# Patient Record
Sex: Male | Born: 1990 | Race: White | Hispanic: No | Marital: Single | Smoking: Former smoker
Health system: Southern US, Community
[De-identification: ages and names within clinical notes are randomized; demographics above are authoritative.]

## PROBLEM LIST (undated history)

## (undated) DIAGNOSIS — F1910 Other psychoactive substance abuse, uncomplicated: Secondary | ICD-10-CM

## (undated) HISTORY — PX: OTHER SURGICAL HISTORY: SHX169

## (undated) HISTORY — DX: Other psychoactive substance abuse, uncomplicated: F19.10

## (undated) HISTORY — PX: TONSILLECTOMY: SUR1361

---

## 2015-04-12 ENCOUNTER — Emergency Department (HOSPITAL_COMMUNITY)
Admission: EM | Admit: 2015-04-12 | Discharge: 2015-04-12 | Disposition: A | Discharge status: Home / Self Care | Payer: Self-pay | Attending: Emergency Medicine | Admitting: Emergency Medicine

## 2015-04-12 ENCOUNTER — Emergency Department (HOSPITAL_COMMUNITY): Payer: Self-pay

## 2015-04-12 ENCOUNTER — Encounter (HOSPITAL_COMMUNITY): Payer: Self-pay | Admitting: Emergency Medicine

## 2015-04-12 DIAGNOSIS — R55 Syncope and collapse: Secondary | ICD-10-CM | POA: Insufficient documentation

## 2015-04-12 DIAGNOSIS — F17200 Nicotine dependence, unspecified, uncomplicated: Secondary | ICD-10-CM | POA: Insufficient documentation

## 2015-04-12 LAB — COMPREHENSIVE METABOLIC PANEL
ALBUMIN: 4.8 g/dL (ref 3.5–5.0)
ALT: 35 U/L (ref 17–63)
ANION GAP: 7 (ref 5–15)
AST: 28 U/L (ref 15–41)
Alkaline Phosphatase: 71 U/L (ref 38–126)
BUN: 12 mg/dL (ref 6–20)
CHLORIDE: 107 mmol/L (ref 101–111)
CO2: 25 mmol/L (ref 22–32)
Calcium: 9.1 mg/dL (ref 8.9–10.3)
Creatinine, Ser: 0.91 mg/dL (ref 0.61–1.24)
GFR calc Af Amer: >60 mL/min (ref >60)
GFR calc non Af Amer: >60 mL/min (ref >60)
Glucose, Bld: 88 mg/dL (ref 65–99)
POTASSIUM: 3.4 mmol/L — AB (ref 3.5–5.1)
SODIUM: 139 mmol/L (ref 135–145)
Total Bilirubin: 0.8 mg/dL (ref 0.3–1.2)
Total Protein: 7.3 g/dL (ref 6.5–8.1)

## 2015-04-12 LAB — URINALYSIS, ROUTINE W REFLEX MICROSCOPIC
BILIRUBIN URINE: NEGATIVE
Glucose, UA: NEGATIVE mg/dL
Hgb urine dipstick: NEGATIVE
LEUKOCYTES UA: NEGATIVE
NITRITE: NEGATIVE
PH: 6.5 (ref 5.0–8.0)
Protein, ur: NEGATIVE mg/dL
SPECIFIC GRAVITY, URINE: 1.015 (ref 1.005–1.030)

## 2015-04-12 LAB — RAPID URINE DRUG SCREEN, HOSP PERFORMED
AMPHETAMINES: NOT DETECTED
BARBITURATES: NOT DETECTED
BENZODIAZEPINES: NOT DETECTED
COCAINE: NOT DETECTED
OPIATES: NOT DETECTED
TETRAHYDROCANNABINOL: POSITIVE — AB

## 2015-04-12 LAB — CBC WITH DIFFERENTIAL/PLATELET
BASOS ABS: 0 10*3/uL (ref 0.0–0.1)
BASOS PCT: 1 %
EOS ABS: 0 10*3/uL (ref 0.0–0.7)
Eosinophils Relative: 1 %
HCT: 44.2 % (ref 39.0–52.0)
Hemoglobin: 15.4 g/dL (ref 13.0–17.0)
Lymphocytes Relative: 35 %
Lymphs Abs: 2.1 10*3/uL (ref 0.7–4.0)
MCH: 29.4 pg (ref 26.0–34.0)
MCHC: 34.8 g/dL (ref 30.0–36.0)
MCV: 84.5 fL (ref 78.0–100.0)
MONO ABS: 0.3 10*3/uL (ref 0.1–1.0)
MONOS PCT: 5 %
NEUTROS PCT: 58 %
Neutro Abs: 3.5 10*3/uL (ref 1.7–7.7)
Platelets: 252 10*3/uL (ref 150–400)
RBC: 5.23 MIL/uL (ref 4.22–5.81)
RDW: 12.6 % (ref 11.5–15.5)
WBC: 5.9 10*3/uL (ref 4.0–10.5)

## 2015-04-12 LAB — I-STAT TROPONIN, ED: Troponin i, poc: 0 ng/mL (ref 0.00–0.08)

## 2015-04-12 MED ORDER — SODIUM CHLORIDE 0.9 % IV BOLUS (SEPSIS)
1000.0000 mL | Freq: Once | INTRAVENOUS | Status: AC
  Administered 2015-04-12: 1000 mL via INTRAVENOUS

## 2015-04-12 MED ORDER — ACETAMINOPHEN 325 MG PO TABS
650.0000 mg | ORAL_TABLET | Freq: Once | ORAL | Status: AC
  Administered 2015-04-12: 650 mg via ORAL
  Filled 2015-04-12: qty 2

## 2015-04-12 NOTE — ED Notes (Signed)
Pt states he has been having episodes for over a month where he feels he cannot get up or see.  Does not lose consciousness, but lays down and cannot move.  Pt denies any current symptoms.

## 2015-04-12 NOTE — ED Provider Notes (Signed)
CSN: [id number]     Arrival date & time 04/12/15  1808 History  By signing my name below, I, Linus Galas, attest that this documentation has been prepared under the direction and in the presence of Bethann Berkshire, MD. Electronically Signed: Linus Galas, ED Scribe. 04/12/2015. 8:16 PM.   Chief Complaint  Patient presents with  . Fatigue    Patient is a 25 y.o. male presenting with near-syncope. The history is provided by the patient. No language interpreter was used.  Near Syncope This is a recurrent problem. The current episode started more than 1 week ago. The problem occurs every several days. The problem has not changed since onset.Pertinent negatives include no chest pain, no abdominal pain and no headaches. The symptoms are aggravated by exertion. Nothing relieves the symptoms. He has tried nothing for the symptoms.   HPI Comments: Theran Vandergrift is a 25 y.o. male with no PMHx who presents to the Emergency Department for an evaluation of a near syncopal episode that occurred earlier today. Pt was working in the yard today when he became dizzy and had to sit down. Pt states that these episodes have been ongoing for the past month. Symptoms are worse with exertion.  Pt denies any other symptoms at this time.   History reviewed. No pertinent past medical history. History reviewed. No pertinent past surgical history. History reviewed. No pertinent family history. Social History  Substance Use Topics  . Smoking status: Never Smoker   . Smokeless tobacco: Current User    Types: Chew  . Alcohol Use: Yes    Review of Systems  Constitutional: Negative for appetite change and fatigue.  HENT: Negative for congestion, ear discharge and sinus pressure.   Eyes: Negative for discharge.  Respiratory: Negative for cough.   Cardiovascular: Positive for near-syncope. Negative for chest pain.  Gastrointestinal: Negative for abdominal pain and diarrhea.  Genitourinary: Negative for frequency and  hematuria.  Musculoskeletal: Negative for back pain.  Skin: Negative for rash.  Neurological: Positive for dizziness. Negative for seizures and headaches.  Psychiatric/Behavioral: Negative for hallucinations.    Allergies  Review of patient's allergies indicates no known allergies.  Home Medications   Prior to Admission medications   Not on File   BP 131/69 mmHg  Pulse 65  Temp(Src) 98.5 F (36.9 C) (Oral)  Resp 16  Ht 6' (1.829 m)  Wt 159 lb (72.122 kg)  BMI 21.56 kg/m2  SpO2 100%   Physical Exam  Constitutional: He is oriented to person, place, and time. He appears well-developed.  HENT:  Head: Normocephalic.  Eyes: Conjunctivae and EOM are normal. No scleral icterus.  Neck: Neck supple. No thyromegaly present.  Cardiovascular: Normal rate and regular rhythm.  Exam reveals no gallop and no friction rub.   No murmur heard. Pulmonary/Chest: No stridor. He has no wheezes. He has no rales. He exhibits no tenderness.  Abdominal: He exhibits no distension. There is no tenderness. There is no rebound.  Musculoskeletal: Normal range of motion. He exhibits no edema.  Lymphadenopathy:    He has no cervical adenopathy.  Neurological: He is oriented to person, place, and time. He exhibits normal muscle tone. Coordination normal.  Skin: No rash noted. No erythema.  Psychiatric: He has a normal mood and affect. His behavior is normal.    ED Course  Procedures   DIAGNOSTIC STUDIES: Oxygen Saturation is 100% on room air, normal by my interpretation.    COORDINATION OF CARE: 8:14 PM Will give fluids. Will order CT  head, EKG, blood work, urinalysis and drug screen. Discussed treatment plan with pt at bedside and pt agreed to plan.  Labs Review Labs Reviewed - No data to display  Imaging Review No results found. I have personally reviewed and evaluated these images and lab results as part of my medical decision-making.   EKG Interpretation None      MDM   Final  diagnoses:  None   Patient with near syncope. Normal CT head chest x-ray lab work. EKG. Patient instructed to take a multivitamin E3 meals a day and plenty of fluids get 8 hours of sleep a night and follow-up with family doctor next week  The chart was scribed for me under my direct supervision.  I personally performed the history, physical, and medical decision making and all procedures in the evaluation of this patient.Bethann Berkshire.    Marlynn Hinckley, MD 04/12/15 [phone]

## 2015-04-12 NOTE — Discharge Instructions (Signed)
Take a multivitamin a day. Eat 3 meals a day. Get 8 hours of sleep a day. Drink plenty of fluids during the day. And follow-up with the family doctor in 1-2 weeks

## 2015-11-24 ENCOUNTER — Encounter (HOSPITAL_COMMUNITY): Payer: Self-pay | Admitting: Emergency Medicine

## 2015-11-24 ENCOUNTER — Emergency Department (HOSPITAL_COMMUNITY)
Admission: EM | Admit: 2015-11-24 | Discharge: 2015-11-24 | Disposition: A | Discharge status: Home / Self Care | Payer: No Typology Code available for payment source | Attending: Emergency Medicine | Admitting: Emergency Medicine

## 2015-11-24 DIAGNOSIS — Y999 Unspecified external cause status: Secondary | ICD-10-CM | POA: Insufficient documentation

## 2015-11-24 DIAGNOSIS — S39012A Strain of muscle, fascia and tendon of lower back, initial encounter: Secondary | ICD-10-CM | POA: Diagnosis not present

## 2015-11-24 DIAGNOSIS — S3992XA Unspecified injury of lower back, initial encounter: Secondary | ICD-10-CM | POA: Diagnosis present

## 2015-11-24 DIAGNOSIS — M542 Cervicalgia: Secondary | ICD-10-CM | POA: Insufficient documentation

## 2015-11-24 DIAGNOSIS — Y92410 Unspecified street and highway as the place of occurrence of the external cause: Secondary | ICD-10-CM | POA: Diagnosis not present

## 2015-11-24 DIAGNOSIS — Y939 Activity, unspecified: Secondary | ICD-10-CM | POA: Insufficient documentation

## 2015-11-24 DIAGNOSIS — Z79899 Other long term (current) drug therapy: Secondary | ICD-10-CM | POA: Diagnosis not present

## 2015-11-24 DIAGNOSIS — F17210 Nicotine dependence, cigarettes, uncomplicated: Secondary | ICD-10-CM | POA: Diagnosis not present

## 2015-11-24 MED ORDER — CYCLOBENZAPRINE HCL 10 MG PO TABS: 10.0000 mg | ORAL_TABLET | Freq: Three times a day (TID) | ORAL | 0 refills | Status: DC | PRN

## 2015-11-24 MED ORDER — TRAMADOL HCL 50 MG PO TABS: 50.0000 mg | ORAL_TABLET | Freq: Four times a day (QID) | ORAL | 0 refills | Status: DC | PRN

## 2015-11-24 MED ORDER — HYDROCODONE-ACETAMINOPHEN 5-325 MG PO TABS: ORAL_TABLET | ORAL | 0 refills | Status: DC

## 2015-11-24 MED ORDER — NAPROXEN 500 MG PO TABS: 500.0000 mg | ORAL_TABLET | Freq: Two times a day (BID) | ORAL | 0 refills | Status: DC

## 2015-11-24 NOTE — Discharge Instructions (Signed)
Apply ice packs on and off to your back and thigh. Return to the ER for any worsening symptoms.
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
## 2016-01-19 ENCOUNTER — Emergency Department (HOSPITAL_COMMUNITY)
Admission: EM | Admit: 2016-01-19 | Discharge: 2016-01-19 | Disposition: A | Discharge status: Home / Self Care | Payer: Self-pay | Attending: Emergency Medicine | Admitting: Emergency Medicine

## 2016-01-19 ENCOUNTER — Encounter (HOSPITAL_COMMUNITY): Payer: Self-pay | Admitting: Emergency Medicine

## 2016-01-19 DIAGNOSIS — R197 Diarrhea, unspecified: Secondary | ICD-10-CM | POA: Insufficient documentation

## 2016-01-19 DIAGNOSIS — R112 Nausea with vomiting, unspecified: Secondary | ICD-10-CM | POA: Insufficient documentation

## 2016-01-19 DIAGNOSIS — F17220 Nicotine dependence, chewing tobacco, uncomplicated: Secondary | ICD-10-CM | POA: Insufficient documentation

## 2016-01-19 MED ORDER — ONDANSETRON HCL 8 MG PO TABS: 8.0000 mg | ORAL_TABLET | Freq: Three times a day (TID) | ORAL | 0 refills | Status: DC | PRN

## 2016-01-19 MED ORDER — FAMOTIDINE 20 MG PO TABS: 20.0000 mg | ORAL_TABLET | Freq: Two times a day (BID) | ORAL | 0 refills | Status: DC

## 2016-01-19 MED ORDER — PANTOPRAZOLE SODIUM 40 MG PO TBEC
40.0000 mg | DELAYED_RELEASE_TABLET | Freq: Once | ORAL | Status: AC
  Administered 2016-01-19: 40 mg via ORAL
  Filled 2016-01-19: qty 1

## 2016-01-19 MED ORDER — PROMETHAZINE HCL 25 MG PO TABS: 25.0000 mg | ORAL_TABLET | Freq: Four times a day (QID) | ORAL | 0 refills | Status: DC | PRN

## 2016-01-19 MED ORDER — ONDANSETRON 8 MG PO TBDP
8.0000 mg | ORAL_TABLET | Freq: Once | ORAL | Status: AC
  Administered 2016-01-19: 8 mg via ORAL
  Filled 2016-01-19: qty 1

## 2016-01-19 NOTE — ED Notes (Signed)
Pt states that he took 8mg  zofran this am.

## 2016-01-19 NOTE — ED Provider Notes (Signed)
[facility] Provider Note   CSN: [id number] Arrival date & time: 01/19/16  1019     History   Chief Complaint Chief Complaint  Patient presents with  . Emesis    HPI Kelvon Lorin PicketScott is a 25 y.o. male.  Nausea and vomiting for 2 weeks especially in the morning. Minimal diarrhea. No fever, sweats, chills, weight loss, hematemesis. He is normally healthy. He smokes cigarettes and drinks alcohol a minimal amount. This has never happened before. Severity of symptoms is mild to moderate.      History reviewed. No pertinent past medical history.  There are no active problems to display for this patient.   History reviewed. No pertinent surgical history.     Home Medications    Prior to Admission medications   Medication Sig Start Date End Date Taking? Authorizing Provider  ondansetron (ZOFRAN) 4 MG tablet Take 4 mg by mouth 2 (two) times daily.   Yes Historical Provider, MD  tetrahydrozoline 0.05 % ophthalmic solution Place 2 drops into both eyes 2 (two) times daily.   Yes Historical Provider, MD  famotidine (PEPCID) 20 MG tablet Take 1 tablet (20 mg total) by mouth 2 (two) times daily. 01/19/16   Donnetta HutchingBrian Deardra Hinkley, MD  promethazine (PHENERGAN) 25 MG tablet Take 1 tablet (25 mg total) by mouth every 6 (six) hours as needed. 01/19/16   Donnetta HutchingBrian Luisfelipe Engelstad, MD    Family History History reviewed. No pertinent family history.  Social History Social History  Substance Use Topics  . Smoking status: Former Smoker    Packs/day: 0.50    Types: Cigarettes    Quit date: 12/20/2015  . Smokeless tobacco: Current User    Types: Chew  . Alcohol use 7.2 oz/week    12 Cans of beer per week     Allergies   Patient has no known allergies.   Review of Systems Review of Systems  All other systems reviewed and are negative.    Physical Exam Updated Vital Signs BP 122/69 (BP Location: Right Arm)   Pulse (!) 54   Temp 97.9 F (36.6 C) (Oral)   Resp 18   SpO2 98%   Physical Exam    Constitutional: He is oriented to person, place, and time. He appears well-developed and well-nourished.  HENT:  Head: Normocephalic and atraumatic.  Eyes: Conjunctivae are normal.  Neck: Neck supple.  Cardiovascular: Normal rate and regular rhythm.   Pulmonary/Chest: Effort normal and breath sounds normal.  Abdominal: Soft. Bowel sounds are normal.  Musculoskeletal: Normal range of motion.  Neurological: He is alert and oriented to person, place, and time.  Skin: Skin is warm and dry.  Psychiatric: He has a normal mood and affect. His behavior is normal.  Nursing note and vitals reviewed.    ED Treatments / Results  Labs (all labs ordered are listed, but only abnormal results are displayed) Labs Reviewed - No data to display  EKG  EKG Interpretation None       Radiology No results found.  Procedures Procedures (including critical care time)  Medications Ordered in ED Medications  ondansetron (ZOFRAN-ODT) disintegrating tablet 8 mg (8 mg Oral Given 01/19/16 1333)  pantoprazole (PROTONIX) EC tablet 40 mg (40 mg Oral Given 01/19/16 1333)     Initial Impression / Assessment and Plan / ED Course  I have reviewed the triage vital signs and the nursing notes.  Pertinent labs & imaging results that were available during my care of the patient were reviewed by me and considered  in my medical decision making (see chart for details).  Clinical Course     Patient is nontoxic-appearing. He has declined IV fluids and labs. Discharge medications Zofran 8 mg and Pepcid 20 mg  Final Clinical Impressions(s) / ED Diagnoses   Final diagnoses:  Nausea and vomiting, intractability of vomiting not specified, unspecified vomiting type    New Prescriptions New Prescriptions   FAMOTIDINE (PEPCID) 20 MG TABLET    Take 1 tablet (20 mg total) by mouth 2 (two) times daily.   PROMETHAZINE (PHENERGAN) 25 MG TABLET    Take 1 tablet (25 mg total) by mouth every 6 (six) hours as needed.      Donnetta HutchingBrian Daivion Pape, MD 01/19/16 [phone]

## 2016-01-19 NOTE — ED Triage Notes (Signed)
Having n/v/d for last 2-3 weeks.  During triage pt say he injured right wrist yesterday at 12 noon, at home.  Rates pain 5/10.

## 2016-01-19 NOTE — Discharge Instructions (Signed)
Medication for nausea and to settle your stomach down. Clear liquids today. Return if worse

## 2016-01-19 NOTE — ED Notes (Signed)
PT requesting to see the EDP at this time and stated he is staying to get a work note for today.

## 2016-01-25 ENCOUNTER — Encounter (HOSPITAL_COMMUNITY): Payer: Self-pay | Admitting: Emergency Medicine

## 2016-01-25 ENCOUNTER — Emergency Department (HOSPITAL_COMMUNITY): Payer: Self-pay

## 2016-01-25 ENCOUNTER — Emergency Department (HOSPITAL_COMMUNITY)
Admission: EM | Admit: 2016-01-25 | Discharge: 2016-01-25 | Disposition: A | Discharge status: Home / Self Care | Payer: Self-pay | Attending: Emergency Medicine | Admitting: Emergency Medicine

## 2016-01-25 DIAGNOSIS — R1013 Epigastric pain: Secondary | ICD-10-CM | POA: Insufficient documentation

## 2016-01-25 DIAGNOSIS — Z79899 Other long term (current) drug therapy: Secondary | ICD-10-CM | POA: Insufficient documentation

## 2016-01-25 DIAGNOSIS — R1114 Bilious vomiting: Secondary | ICD-10-CM | POA: Insufficient documentation

## 2016-01-25 DIAGNOSIS — F17220 Nicotine dependence, chewing tobacco, uncomplicated: Secondary | ICD-10-CM | POA: Insufficient documentation

## 2016-01-25 LAB — CBC WITH DIFFERENTIAL/PLATELET
BASOS ABS: 0 10*3/uL (ref 0.0–0.1)
Basophils Relative: 0 %
Eosinophils Absolute: 0.1 10*3/uL (ref 0.0–0.7)
Eosinophils Relative: 1 %
HEMATOCRIT: 46.5 % (ref 39.0–52.0)
Hemoglobin: 16.5 g/dL (ref 13.0–17.0)
LYMPHS PCT: 32 %
Lymphs Abs: 1.6 10*3/uL (ref 0.7–4.0)
MCH: 30.4 pg (ref 26.0–34.0)
MCHC: 35.5 g/dL (ref 30.0–36.0)
MCV: 85.6 fL (ref 78.0–100.0)
Monocytes Absolute: 0.4 10*3/uL (ref 0.1–1.0)
Monocytes Relative: 7 %
NEUTROS ABS: 2.9 10*3/uL (ref 1.7–7.7)
Neutrophils Relative %: 60 %
Platelets: 242 10*3/uL (ref 150–400)
RBC: 5.43 MIL/uL (ref 4.22–5.81)
RDW: 12.3 % (ref 11.5–15.5)
WBC: 5 10*3/uL (ref 4.0–10.5)

## 2016-01-25 LAB — COMPREHENSIVE METABOLIC PANEL
ALT: 36 U/L (ref 17–63)
AST: 23 U/L (ref 15–41)
Albumin: 4.4 g/dL (ref 3.5–5.0)
Alkaline Phosphatase: 62 U/L (ref 38–126)
Anion gap: 6 (ref 5–15)
BUN: 11 mg/dL (ref 6–20)
CHLORIDE: 105 mmol/L (ref 101–111)
CO2: 27 mmol/L (ref 22–32)
CREATININE: 1.03 mg/dL (ref 0.61–1.24)
Calcium: 9.3 mg/dL (ref 8.9–10.3)
GFR calc Af Amer: >60 mL/min (ref >60)
Glucose, Bld: 96 mg/dL (ref 65–99)
Potassium: 4 mmol/L (ref 3.5–5.1)
Sodium: 138 mmol/L (ref 135–145)
TOTAL PROTEIN: 7.1 g/dL (ref 6.5–8.1)
Total Bilirubin: 1.2 mg/dL (ref 0.3–1.2)

## 2016-01-25 LAB — URINALYSIS, ROUTINE W REFLEX MICROSCOPIC
BILIRUBIN URINE: NEGATIVE
GLUCOSE, UA: NEGATIVE mg/dL
HGB URINE DIPSTICK: NEGATIVE
Ketones, ur: NEGATIVE mg/dL
Leukocytes, UA: NEGATIVE
Nitrite: NEGATIVE
Protein, ur: NEGATIVE mg/dL
SPECIFIC GRAVITY, URINE: 1.01 (ref 1.005–1.030)
pH: 8 (ref 5.0–8.0)

## 2016-01-25 LAB — LIPASE, BLOOD: LIPASE: 18 U/L (ref 11–51)

## 2016-01-25 MED ORDER — OMEPRAZOLE 20 MG PO CPDR: 20.0000 mg | DELAYED_RELEASE_CAPSULE | Freq: Every day | ORAL | 0 refills | Status: DC

## 2016-01-25 MED ORDER — SODIUM CHLORIDE 0.9 % IV BOLUS (SEPSIS)
1000.0000 mL | Freq: Once | INTRAVENOUS | Status: AC
  Administered 2016-01-25: 1000 mL via INTRAVENOUS

## 2016-01-25 MED ORDER — PROMETHAZINE HCL 25 MG PO TABS: 25.0000 mg | ORAL_TABLET | Freq: Four times a day (QID) | ORAL | 0 refills | Status: DC | PRN

## 2016-01-25 MED ORDER — ONDANSETRON 8 MG PO TBDP: 8.0000 mg | ORAL_TABLET | Freq: Three times a day (TID) | ORAL | 0 refills | Status: DC | PRN

## 2016-01-25 MED ORDER — PROMETHAZINE HCL 25 MG/ML IJ SOLN
12.5000 mg | Freq: Once | INTRAMUSCULAR | Status: AC
  Administered 2016-01-25: 12.5 mg via INTRAVENOUS
  Filled 2016-01-25: qty 1

## 2016-01-25 NOTE — Discharge Instructions (Signed)
Refer to the instructions above about the causes of nausea and vomiting.  Your lab tests and your ultrasound today are negative.  You may take the phenergan since this seems to work better for your symptoms when you do not need to be at work (since this makes you drowsy).  You may take the zofran instead on your work days.    Common reasons for nausea and vomiting with no apparent blood test or ultrasound abnormalities may include acid reflux disease, stomach or duodenal ulcers, intolerance to certain medicines or foods and marijuana use.  You are being treated with a stronger medicine today to cover you for the possibility of acid reflux or an ulcer as the source of your symptoms.

## 2016-01-25 NOTE — ED Triage Notes (Signed)
Pt c/o emesis daily x 2 weeks. Sometimes blood in emesis. Seen here a few days ago for same. States with meds no better. Mm very wet. States lurinates only about once a day.

## 2016-01-25 NOTE — ED Notes (Signed)
Patient transported to Ultrasound 

## 2016-01-25 NOTE — ED Notes (Signed)
Pt also c/o dizziness since vomiting started.

## 2016-01-25 NOTE — ED Notes (Signed)
Pt returned from US

## 2016-01-25 NOTE — ED Notes (Addendum)
Pts mother requesting to speak w EDP. Provider notified. Mother updated that US was backed up and will be 30 mins

## 2016-01-29 NOTE — ED Provider Notes (Signed)
[facility] DEPT Provider Note   CSN: [id number] Arrival date & time: 01/25/16  0908     History   Chief Complaint Chief Complaint  Patient presents with  . Emesis    HPI Francisco Rosales is a 25 y.o. male presenting with daily emesis for the past 2 weeks, stating he wakes nauseated and vomits for the first few hours of the day, then gets some improvement in the afternoon and evening. He was seen for this same problem here last week and refused tests but was given zofran and pepcid which has not been helpful.  He does endorse occasional epigastric discomfort with these symptoms.  He denies history of acid reflux. He has had no fevers, chills, back pain, dysuria, unexplained weight loss.  He was made to come here by his employer before returning to work.  The history is provided by the patient and a parent.    History reviewed. No pertinent past medical history.  There are no active problems to display for this patient.   Past Surgical History:  Procedure Laterality Date  . addenoidectomy    . TONSILLECTOMY         Home Medications    Prior to Admission medications   Medication Sig Start Date End Date Taking? Authorizing Provider  famotidine (PEPCID) 20 MG tablet Take 1 tablet (20 mg total) by mouth 2 (two) times daily. 01/19/16  Yes Donnetta HutchingBrian Cook, MD  ondansetron (ZOFRAN) 8 MG tablet Take 1 tablet (8 mg total) by mouth 3 (three) times daily as needed. 01/19/16  Yes Donnetta HutchingBrian Cook, MD  tetrahydrozoline 0.05 % ophthalmic solution Place 2 drops into both eyes 2 (two) times daily.   Yes Historical Provider, MD  omeprazole (PRILOSEC) 20 MG capsule Take 1 capsule (20 mg total) by mouth daily. 01/25/16   Burgess AmorJulie Emigdio Wildeman, PA-C  ondansetron (ZOFRAN ODT) 8 MG disintegrating tablet Take 1 tablet (8 mg total) by mouth every 8 (eight) hours as needed for nausea or vomiting. 01/25/16   Burgess AmorJulie Tenoch Mcclure, PA-C  promethazine (PHENERGAN) 25 MG tablet Take 1 tablet (25 mg total) by mouth every 6 (six) hours as  needed for nausea or vomiting. 01/25/16   Burgess AmorJulie Aubree Doody, PA-C    Family History History reviewed. No pertinent family history.  Social History Social History  Substance Use Topics  . Smoking status: Former Smoker    Packs/day: 0.50    Types: Cigarettes    Quit date: 12/20/2015  . Smokeless tobacco: Current User    Types: Chew  . Alcohol use 7.2 oz/week    12 Cans of beer per week     Comment: occ     Allergies   Patient has no known allergies.   Review of Systems Review of Systems  Constitutional: Negative for fever.  HENT: Negative for congestion and sore throat.   Eyes: Negative.   Respiratory: Negative for chest tightness and shortness of breath.   Cardiovascular: Negative for chest pain.  Gastrointestinal: Positive for abdominal pain, nausea and vomiting.  Genitourinary: Negative.   Musculoskeletal: Negative for arthralgias, joint swelling and neck pain.  Skin: Negative.  Negative for rash and wound.  Neurological: Negative for dizziness, weakness, light-headedness, numbness and headaches.  Psychiatric/Behavioral: Negative.      Physical Exam Updated Vital Signs BP 117/64   Pulse (!) 55   Temp 98.3 F (36.8 C) (Oral)   Resp 17   Ht 6' (1.829 m)   Wt 83.9 kg   SpO2 99%   BMI 25.09 kg/m  Physical Exam  Constitutional: He appears well-developed and well-nourished.  HENT:  Head: Normocephalic and atraumatic.  Mouth/Throat: Oropharynx is clear and moist.  Eyes: Conjunctivae are normal.  Neck: Normal range of motion.  Cardiovascular: Normal rate, regular rhythm, normal heart sounds and intact distal pulses.   Pulmonary/Chest: Effort normal and breath sounds normal. He has no wheezes.  Abdominal: Soft. Bowel sounds are normal. He exhibits no distension. There is tenderness in the right upper quadrant and epigastric area. There is no guarding and negative Murphy's sign.  Musculoskeletal: Normal range of motion.  Neurological: He is alert.  Skin: Skin is  warm and dry.  Psychiatric: He has a normal mood and affect.  Nursing note and vitals reviewed.    ED Treatments / Results  Labs (all labs ordered are listed, but only abnormal results are displayed) Labs Reviewed  URINALYSIS, ROUTINE W REFLEX MICROSCOPIC - Abnormal; Notable for the following:       Result Value   APPearance CLOUDY (*)    All other components within normal limits  CBC WITH DIFFERENTIAL/PLATELET  COMPREHENSIVE METABOLIC PANEL  LIPASE, BLOOD    EKG  EKG Interpretation None       Radiology  Results for orders placed or performed during the hospital encounter of 01/25/16  CBC with Differential  Result Value Ref Range   WBC 5.0 4.0 - 10.5 K/uL   RBC 5.43 4.22 - 5.81 MIL/uL   Hemoglobin 16.5 13.0 - 17.0 g/dL   HCT 16.1 09.6 - 04.5 %   MCV 85.6 78.0 - 100.0 fL   MCH 30.4 26.0 - 34.0 pg   MCHC 35.5 30.0 - 36.0 g/dL   RDW 40.9 81.1 - 91.4 %   Platelets 242 150 - 400 K/uL   Neutrophils Relative % 60 %   Neutro Abs 2.9 1.7 - 7.7 K/uL   Lymphocytes Relative 32 %   Lymphs Abs 1.6 0.7 - 4.0 K/uL   Monocytes Relative 7 %   Monocytes Absolute 0.4 0.1 - 1.0 K/uL   Eosinophils Relative 1 %   Eosinophils Absolute 0.1 0.0 - 0.7 K/uL   Basophils Relative 0 %   Basophils Absolute 0.0 0.0 - 0.1 K/uL  Comprehensive metabolic panel  Result Value Ref Range   Sodium 138 135 - 145 mmol/L   Potassium 4.0 3.5 - 5.1 mmol/L   Chloride 105 101 - 111 mmol/L   CO2 27 22 - 32 mmol/L   Glucose, Bld 96 65 - 99 mg/dL   BUN 11 6 - 20 mg/dL   Creatinine, Ser 7.82 0.61 - 1.24 mg/dL   Calcium 9.3 8.9 - 95.6 mg/dL   Total Protein 7.1 6.5 - 8.1 g/dL   Albumin 4.4 3.5 - 5.0 g/dL   AST 23 15 - 41 U/L   ALT 36 17 - 63 U/L   Alkaline Phosphatase 62 38 - 126 U/L   Total Bilirubin 1.2 0.3 - 1.2 mg/dL   GFR calc non Af Amer >60 >60 mL/min   GFR calc Af Amer >60 >60 mL/min   Anion gap 6 5 - 15  Urinalysis, Routine w reflex microscopic  Result Value Ref Range   Color, Urine YELLOW  YELLOW   APPearance CLOUDY (A) CLEAR   Specific Gravity, Urine 1.010 1.005 - 1.030   pH 8.0 5.0 - 8.0   Glucose, UA NEGATIVE NEGATIVE mg/dL   Hgb urine dipstick NEGATIVE NEGATIVE   Bilirubin Urine NEGATIVE NEGATIVE   Ketones, ur NEGATIVE NEGATIVE mg/dL   Protein, ur  NEGATIVE NEGATIVE mg/dL   Nitrite NEGATIVE NEGATIVE   Leukocytes, UA NEGATIVE NEGATIVE  Lipase, blood  Result Value Ref Range   Lipase 18 11 - 51 U/L   [place] Abdomen Complete  Result Date: 01/25/2016 CLINICAL DATA:  Right upper quadrant abdominal pain, nausea/vomiting EXAM: ABDOMEN ULTRASOUND COMPLETE COMPARISON:  None. FINDINGS: Gallbladder: No gallstones, gallbladder wall thickening, or pericholecystic fluid. Common bile duct: Diameter: 3 mm Liver: No focal lesion identified. Within normal limits in parenchymal echogenicity. IVC: No abnormality visualized. Pancreas: Visualized portion unremarkable. Spleen: Size and appearance within normal limits. Right Kidney: Length: 11.4 cm.  No mass or hydronephrosis. Left Kidney: Length: 12.8 cm.  No mass or hydronephrosis. Abdominal aorta: No aneurysm visualized. Other findings: None. IMPRESSION: Negative abdominal ultrasound. Electronically Signed   By: Charline BillsSriyesh  Krishnan M.D.   On: 01/25/2016 13:15     Procedures Procedures (including critical care time)  Medications Ordered in ED Medications  promethazine (PHENERGAN) injection 12.5 mg (12.5 mg Intravenous Given 01/25/16 1102)  sodium chloride 0.9 % bolus 1,000 mL (0 mLs Intravenous Stopped 01/25/16 1320)     Initial Impression / Assessment and Plan / ED Course  I have reviewed the triage vital signs and the nursing notes.  Pertinent labs & imaging results that were available during my care of the patient were reviewed by me and considered in my medical decision making (see chart for details).  Clinical Course     N/V of unclear etiology. Pt prescribed phenergan as this was more effective for relieving nausea sx, prilosec.   Referral to GI for further eval.  Encouraged this recheck if sx persist.    The patient appears reasonably screened and/or stabilized for discharge and I doubt any other medical condition or other [facility] requiring further screening, evaluation, or treatment in the ED at this time prior to discharge.   Final Clinical Impressions(s) / ED Diagnoses   Final diagnoses:  Bilious vomiting with nausea    New Prescriptions Discharge Medication List as of 01/25/2016  1:28 PM    START taking these medications   Details  omeprazole (PRILOSEC) 20 MG capsule Take 1 capsule (20 mg total) by mouth daily., Starting Thu 01/25/2016, Print    ondansetron (ZOFRAN ODT) 8 MG disintegrating tablet Take 1 tablet (8 mg total) by mouth every 8 (eight) hours as needed for nausea or vomiting., Starting Thu 01/25/2016, Print         Burgess AmorJulie Christiane Sistare, PA-C 01/29/16 1950    Bethann BerkshireJoseph Zammit, MD 02/06/16 1253

## 2016-03-06 ENCOUNTER — Encounter: Payer: Self-pay | Admitting: Family Medicine

## 2016-03-06 ENCOUNTER — Ambulatory Visit (INDEPENDENT_AMBULATORY_CARE_PROVIDER_SITE_OTHER): Payer: BLUE CROSS/BLUE SHIELD | Admitting: Family Medicine

## 2016-03-06 VITALS — BP 118/78 | HR 80 | Temp 97.9°F | Resp 18 | Ht 72.0 in | Wt 197.0 lb

## 2016-03-06 DIAGNOSIS — Z720 Tobacco use: Secondary | ICD-10-CM

## 2016-03-06 DIAGNOSIS — F1010 Alcohol abuse, uncomplicated: Secondary | ICD-10-CM | POA: Insufficient documentation

## 2016-03-06 DIAGNOSIS — L309 Dermatitis, unspecified: Secondary | ICD-10-CM

## 2016-03-06 DIAGNOSIS — Z7689 Persons encountering health services in other specified circumstances: Secondary | ICD-10-CM | POA: Diagnosis not present

## 2016-03-06 DIAGNOSIS — R1114 Bilious vomiting: Secondary | ICD-10-CM | POA: Diagnosis not present

## 2016-03-06 DIAGNOSIS — K920 Hematemesis: Secondary | ICD-10-CM

## 2016-03-06 DIAGNOSIS — Z2821 Immunization not carried out because of patient refusal: Secondary | ICD-10-CM | POA: Insufficient documentation

## 2016-03-06 DIAGNOSIS — K921 Melena: Secondary | ICD-10-CM | POA: Insufficient documentation

## 2016-03-06 MED ORDER — ONDANSETRON HCL 8 MG PO TABS: 8.0000 mg | ORAL_TABLET | Freq: Two times a day (BID) | ORAL | 0 refills | Status: DC

## 2016-03-06 NOTE — Progress Notes (Signed)
Chief Complaint  Patient presents with  . 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  He is coughing more during day.  He states a respirator mask is provided which he chooses not to wear because he does not like it. I explained that this might be a big part of his problem, and encouraged him to find a mask he can tolerate.   Patient Active Problem List   Diagnosis Date Noted  . Hematochezia 03/06/2016  . Tobacco abuse 03/06/2016  . Alcohol abuse 03/06/2016  . Bilious vomiting with nausea 03/06/2016  . Immunization refused 03/06/2016    Outpatient Encounter Prescriptions as of 03/06/2016  Medication Sig  . omeprazole (PRILOSEC) 20 MG capsule Take 20 mg by mouth daily.  . ondansetron (ZOFRAN) 8 MG tablet Take 1 tablet (8 mg total) by mouth 2 (two) times daily.   No facility-administered encounter medications on file as of 03/06/2016.     Past Medical History:  Diagnosis Date  . Substance abuse    alcoholic    Past Surgical History:  Procedure Laterality Date  . addenoidectomy    . TONSILLECTOMY      Social History   Social History  . Marital status: Single    Spouse name: N/A  . Number of children: N/A  . Years of education: 9   Occupational History  . pre cast concrete    Social History Main Topics  . Smoking status: Current Every Day Smoker    Packs/day: 0.50    Types: Cigarettes  . Smokeless tobacco: Current User   Types: Chew     Comment: trying to quit  . Alcohol use No     Comment: recovered alcoholic  . Drug use: No  . Sexual activity: Yes    Birth control/ protection: None   Other Topics Concern  . Not on file   Social History Narrative   Lives with mother and brother    Family History  Problem Relation Age of Onset  . Diabetes Maternal Aunt     Review of Systems  Constitutional: Positive for malaise/fatigue. Negative for chills, fever and weight loss.  HENT: Negative for congestion and hearing loss.   Eyes: Negative for blurred vision and pain.  Respiratory: Positive for cough. Negative for shortness of breath.   Cardiovascular: Negative for chest pain and leg swelling.  Gastrointestinal: Positive for abdominal pain, heartburn, nausea and vomiting. Negative for blood in stool, constipation and diarrhea.  Genitourinary: Negative for dysuria and frequency.  Musculoskeletal: Negative for falls, joint pain and myalgias.  Skin: Positive for rash.  Neurological: Negative for dizziness, seizures and headaches.  Psychiatric/Behavioral: Negative for depression. The patient is not nervous/anxious and does not have insomnia.     BP 118/78 (BP Location: Right Arm, Patient Position: Sitting, Cuff Size: Normal)   Pulse 80   Temp 97.9 F (36.6 C) (Oral)   Resp 18   Ht 6' (1.829 m)  Wt 197 lb (89.4 kg)   SpO2 98%   BMI 26.72 kg/m   Physical Exam  Constitutional: He is oriented to person, place, and time. He appears well-developed and well-nourished. No distress.  HENT:  Head: Normocephalic and atraumatic.  Right Ear: External ear normal.  Left Ear: External ear normal.  Mouth/Throat: Oropharynx is clear and moist.  Eyes: Conjunctivae are normal. Pupils are equal, round, and reactive to light.  Neck: Normal range of motion. No thyromegaly present.  Cardiovascular: Normal rate, regular rhythm and normal heart sounds.   Pulmonary/Chest: Effort normal and breath sounds normal. No  respiratory distress. He has no wheezes.  Abdominal: Soft. Bowel sounds are normal. He exhibits no distension. There is no tenderness. There is no guarding.  Musculoskeletal: Normal range of motion. He exhibits no edema.  Lymphadenopathy:    He has no cervical adenopathy.  Neurological: He is alert and oriented to person, place, and time.  Skin:  Right hand with star tattoo and there is raised skin inside border in shape of star ? Reaction to dye  Psychiatric: He has a normal mood and affect. His behavior is normal.   ASSESSMENT/PLAN:  1. Encounter to establish care with new doctor   2. Bilious vomiting with nausea  - Ambulatory referral to Gastroenterology  3. Dermatitis  - Ambulatory referral to Dermatology  4. Hematemesis with nausea  - Ambulatory referral to Gastroenterology  5. Tobacco abuse   6. Alcohol abuse   7. Immunization refused    Patient Instructions  Try to quit smoking Take the omeprazole twice a day Take the zofran as needed  I have placed a referral to GI I have placed a referral to dermatology  See me in a month or two   Eustace Moore, MD

## 2016-03-06 NOTE — Patient Instructions (Signed)
Try to quit smoking Take the omeprazole twice a day Take the zofran as needed  I have placed a referral to GI I have placed a referral to dermatology  See me in a month or two

## 2016-03-11 ENCOUNTER — Encounter: Payer: Self-pay | Admitting: Gastroenterology

## 2016-03-27 ENCOUNTER — Ambulatory Visit: Payer: BLUE CROSS/BLUE SHIELD | Admitting: Nurse Practitioner

## 2016-04-04 ENCOUNTER — Encounter: Payer: Self-pay | Admitting: Nurse Practitioner

## 2016-04-04 ENCOUNTER — Ambulatory Visit: Payer: BLUE CROSS/BLUE SHIELD | Admitting: Nurse Practitioner

## 2016-04-04 ENCOUNTER — Telehealth: Payer: Self-pay | Admitting: Nurse Practitioner

## 2016-04-04 NOTE — Telephone Encounter (Signed)
Pt was a no show and letter sent  °

## 2016-04-08 NOTE — Telephone Encounter (Signed)
Noted  

## 2016-04-30 ENCOUNTER — Ambulatory Visit: Payer: BLUE CROSS/BLUE SHIELD | Admitting: Family Medicine

## 2017-07-09 ENCOUNTER — Encounter: Payer: Self-pay | Admitting: Family Medicine

## 2017-07-09 ENCOUNTER — Other Ambulatory Visit: Payer: Self-pay

## 2017-07-09 ENCOUNTER — Ambulatory Visit (INDEPENDENT_AMBULATORY_CARE_PROVIDER_SITE_OTHER): Payer: BLUE CROSS/BLUE SHIELD | Admitting: Family Medicine

## 2017-07-09 VITALS — BP 106/70 | HR 62 | Temp 98.7°F | Resp 12 | Ht 72.0 in | Wt 216.1 lb

## 2017-07-09 DIAGNOSIS — L089 Local infection of the skin and subcutaneous tissue, unspecified: Secondary | ICD-10-CM

## 2017-07-09 DIAGNOSIS — Z23 Encounter for immunization: Secondary | ICD-10-CM

## 2017-07-09 DIAGNOSIS — L309 Dermatitis, unspecified: Secondary | ICD-10-CM

## 2017-07-09 DIAGNOSIS — Z720 Tobacco use: Secondary | ICD-10-CM

## 2017-07-09 MED ORDER — SULFAMETHOXAZOLE-TRIMETHOPRIM 800-160 MG PO TABS: 1.0000 | ORAL_TABLET | Freq: Two times a day (BID) | ORAL | 0 refills | Status: DC

## 2017-07-09 MED ORDER — TRIAMCINOLONE ACETONIDE 0.1 % EX CREA: 1.0000 "application " | TOPICAL_CREAM | Freq: Two times a day (BID) | CUTANEOUS | 0 refills | Status: DC

## 2017-07-09 NOTE — Progress Notes (Signed)
Patient ID: Francisco Rosales, male    DOB: 01-17-1991, 27 y.o.   MRN: [id number]  Chief Complaint  Patient presents with  . Establish Care    Former Francisco Rosales patient  . 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  Subjective:   Francisco Rosales is a 27 y.o. male who [facility].  HPI Mr. Francisco Rosales presents today as a new patient to establish care after being followed by Francisco Rosales. Lily Francisco Rosales.  He comes in today to discuss an area on his right hand.  He reports that he has had this skin lesion on right hand, has been there for about a year. Was told to "just watch it" per his mother's report, by Francisco Rosales. Delton Rosales. Has never been biopsied. Has put peroxide on it.  He reports that the area on the skin breaks open and drains green. Has never been given antibiotics.  He reports that the area itches and then he tends to mess and picks at the area.  From time to time he reports that it feels sore.  He reports that he often gets out his pocket knife and scratches the area with the knife because it itches.  The area is around the borders of a star tattoo which he has on his hand.  He reports that the tattoo is not new and is many years old.  He reports that is over 26 years old.  He reports that he never had this problem and to may be a year or so ago. Has not used any creams or ointments on it. Quit working at [employer] in 01/2017.  Reports that in the past that had raised up and stuff it drained out of it.  He reports that he squeezed and got all the material out of it.  It is not draining at this time.  Reports that he is not smoking any cigarettes.  He reports that he occasionally dips. Lives with his mother and brother.  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   Past Medical History:  Diagnosis Date  . Substance abuse (HCC)    alcoholic    Past Surgical History:  Procedure Laterality  Date  . addenoidectomy    . TONSILLECTOMY      Family History  Problem Relation Age of Onset  . Diabetes Maternal Aunt      Social History   Socioeconomic History  . Marital status: Single    Spouse name: Not on file  . Number of children: Not on file  . Years of education: 849  . Highest education level: Not on file  Occupational History  . Occupation: pre cast concrete  Social Needs  . Financial resource strain: Not on file  . Food insecurity:    Worry: Not on file    Inability: Not on file  . Transportation needs:    Medical: Not on file    Non-medical: Not on file  Tobacco Use  . Smoking status: Former Smoker    Packs/day: 0.50    Types: Cigarettes  . Smokeless tobacco: Current User    Types: Chew  . Tobacco comment: trying to quit  Substance and Sexual Activity  . Alcohol use: No    Comment: recovered alcoholic  . Drug use: No  . Sexual activity: Yes    Birth control/protection: None  Lifestyle  . Physical  activity:    Days per week: Not on file    Minutes per session: Not on file  . Stress: Not on file  Relationships  . Social connections:    Talks on phone: Not on file    Gets together: Not on file    Attends religious service: Not on file    Active member of club or organization: Not on file    Attends meetings of clubs or organizations: Not on file    Relationship status: Not on file  Other Topics Concern  . Not on file  Social History Narrative   Lives with mother and brother.   Used to work at [employer].    Works at [employer].    Does not eat fruits or veggies.   Does not smoke.   Dips tobacco.   Enjoys hunting and fishing.    Current Outpatient Medications on File Prior to Visit  Medication Sig Dispense Refill  . ranitidine (ZANTAC) 150 MG tablet Take 150 mg by mouth 2 (two) times daily as needed for heartburn.     No current facility-administered medications on file prior to visit.     Review of Systems   Objective:   BP  106/70 (BP Location: Left Arm, Patient Position: Sitting, Cuff Size: Large)   Pulse 62   Temp 98.7 F (37.1 C) (Temporal)   Resp 12   Ht 6' (1.829 m)   Wt 216 lb 1.9 oz (98 kg)   SpO2 96%   BMI 29.31 kg/m   Physical Exam  Constitutional: He is oriented to person, place, and time. He appears well-developed and well-nourished.  HENT:  Head: Normocephalic and atraumatic.  Eyes: Pupils are equal, round, and reactive to light. EOM are normal.  Neck: Normal range of motion. Neck supple. No thyromegaly present.  Cardiovascular: Normal rate, regular rhythm and normal heart sounds.  Pulmonary/Chest: Effort normal and breath sounds normal.  Abdominal: Soft. Bowel sounds are normal.  Musculoskeletal: He exhibits no edema.  Lymphadenopathy:    He has no cervical adenopathy.  Neurological: He is alert and oriented to person, place, and time. No cranial nerve deficit.  Skin: Skin is warm, dry and intact. Capillary refill takes less than 2 seconds.  Clearly demarcated star tattoo on the dorsum of right hand.  Upper border of tattoo with thickened whitish scale and mild surrounding erythema with associated lichenification.  No drainage associated with area.  No fluctuance.  The skin lesion in question follows the borders of the tattoo.    Neurovasculature intact in upper extremities bilaterally.  Patient covered with multiple tattoos on neck, arms, torso, and hands.  Psychiatric:  Patient looks to his mother to answer most of his questions.  His mood is somewhat inappropriate and childlike.  Thought processes  without delusions or phobias.  Behavior appropriate.  Questionable normal cognition.  Vitals reviewed.  Depression screen [facility] 2/9 07/09/2017 03/06/2016  Decreased Interest 0 0  Down, Depressed, Hopeless 0 0  PHQ - 2 Score 0 0    Assessment and Plan   1. Eczema, unspecified type Long discussion with patient and his mother today.  I believe that his skin rash is secondary to allergy/skin  irritant.  Possibly secondary to association with tattoos since the skin rash follows the borders of the tattoo.  Today we discussed eczema.  We discussed skin hydration.  I also believe that patient has an itch scratch cycle in regards to this lesion.  I discussed with him that I  believe the episodes of drainage that he is describing are most likely a result of secondary infection from him picking and scraping the skin rash.  We will try triamcinolone twice a day as directed and after 1 to 2 weeks decrease to once a day, and then decreased to as needed.  We also discussed that if the area was not healed and cleared up that we would refer him to dermatology for biopsy. - triamcinolone cream (KENALOG) 0.1 %; Apply 1 application topically 2 (two) times daily.  Dispense: 30 g; Refill: 0  2. Skin infection We will go ahead and treat at this time for secondary infection due to the history of this area draining even last week.  There is no evidence of this seen but will treat at this time. Patient counseled in detail regarding the risks of medication. Told to call or return to clinic if develop any worrisome signs or symptoms. Patient voiced understanding.   - sulfamethoxazole-trimethoprim (BACTRIM DS,SEPTRA DS) 800-160 MG tablet; Take 1 tablet by mouth 2 (two) times daily.  Dispense: 20 tablet; Refill: 0  3. Immunization due Vaccination given. - Tdap vaccine greater than or equal to 7yo IM   Recommend patient to follow-up for complete physical examination. The 5 A's Model for treating Tobacco Use and Dependence was used today. I have identified and documented tobacco use status for this patient. I have urged the patient to quit tobacco use. At this time, the patient is unwilling and not ready to attempt to quit. I have provided patient with information regarding risks, cessation techniques, and interventions that might increase future attempts to quit smoking. I will plan on again addressing tobacco  dependence at the next visit.  Return in about 1 month (around 08/06/2017) for follow up. Aliene Beams, MD 07/09/2017

## 2017-07-11 ENCOUNTER — Encounter: Payer: Self-pay | Admitting: Family Medicine

## 2017-07-21 ENCOUNTER — Telehealth: Payer: Self-pay | Admitting: Family Medicine

## 2017-07-21 ENCOUNTER — Other Ambulatory Visit: Payer: Self-pay

## 2017-07-21 DIAGNOSIS — L309 Dermatitis, unspecified: Secondary | ICD-10-CM

## 2017-07-21 MED ORDER — TRIAMCINOLONE ACETONIDE 0.1 % EX CREA: 1.0000 "application " | TOPICAL_CREAM | Freq: Two times a day (BID) | CUTANEOUS | 0 refills | Status: DC

## 2017-07-21 NOTE — Telephone Encounter (Signed)
Spoke with patient and verified which pharmacy he would like cream sent to [facility] Resent cream to pharmacy. Patient notified.

## 2017-07-21 NOTE — Telephone Encounter (Signed)
Can you re-send the cream medication you sent in on 6/5.  The Pharmacy says they do not have it.  We show confirmation on 07/09/17.  They have re-checked with the pharmacy and they do not have it.

## 2017-08-13 ENCOUNTER — Ambulatory Visit: Payer: BLUE CROSS/BLUE SHIELD | Admitting: Family Medicine

## 2017-08-27 ENCOUNTER — Ambulatory Visit: Payer: BLUE CROSS/BLUE SHIELD | Admitting: Family Medicine

## 2019-04-27 ENCOUNTER — Encounter (HOSPITAL_COMMUNITY): Payer: Self-pay | Admitting: *Deleted

## 2019-04-27 ENCOUNTER — Emergency Department (HOSPITAL_COMMUNITY): Payer: Self-pay

## 2019-04-27 ENCOUNTER — Other Ambulatory Visit: Payer: Self-pay

## 2019-04-27 ENCOUNTER — Emergency Department (HOSPITAL_COMMUNITY)
Admission: EM | Admit: 2019-04-27 | Discharge: 2019-04-27 | Disposition: A | Discharge status: Home / Self Care | Payer: Self-pay | Attending: Emergency Medicine | Admitting: Emergency Medicine

## 2019-04-27 DIAGNOSIS — Z79899 Other long term (current) drug therapy: Secondary | ICD-10-CM | POA: Insufficient documentation

## 2019-04-27 DIAGNOSIS — S51811A Laceration without foreign body of right forearm, initial encounter: Secondary | ICD-10-CM | POA: Insufficient documentation

## 2019-04-27 DIAGNOSIS — Z23 Encounter for immunization: Secondary | ICD-10-CM | POA: Insufficient documentation

## 2019-04-27 DIAGNOSIS — S56821A Laceration of other muscles, fascia and tendons at forearm level, right arm, initial encounter: Secondary | ICD-10-CM | POA: Insufficient documentation

## 2019-04-27 DIAGNOSIS — Y999 Unspecified external cause status: Secondary | ICD-10-CM | POA: Insufficient documentation

## 2019-04-27 DIAGNOSIS — Z87891 Personal history of nicotine dependence: Secondary | ICD-10-CM | POA: Insufficient documentation

## 2019-04-27 DIAGNOSIS — S41111A Laceration without foreign body of right upper arm, initial encounter: Secondary | ICD-10-CM

## 2019-04-27 DIAGNOSIS — Y939 Activity, unspecified: Secondary | ICD-10-CM | POA: Insufficient documentation

## 2019-04-27 DIAGNOSIS — W25XXXA Contact with sharp glass, initial encounter: Secondary | ICD-10-CM | POA: Insufficient documentation

## 2019-04-27 DIAGNOSIS — Y929 Unspecified place or not applicable: Secondary | ICD-10-CM | POA: Insufficient documentation

## 2019-04-27 DIAGNOSIS — S50851A Superficial foreign body of right forearm, initial encounter: Secondary | ICD-10-CM

## 2019-04-27 DIAGNOSIS — W01110A Fall on same level from slipping, tripping and stumbling with subsequent striking against sharp glass, initial encounter: Secondary | ICD-10-CM | POA: Insufficient documentation

## 2019-04-27 LAB — CBC WITH DIFFERENTIAL/PLATELET
Abs Immature Granulocytes: 0.03 10*3/uL (ref 0.00–0.07)
Basophils Absolute: 0 10*3/uL (ref 0.0–0.1)
Basophils Relative: 0 %
Eosinophils Absolute: 0 10*3/uL (ref 0.0–0.5)
Eosinophils Relative: 0 %
HCT: 51.3 % (ref 39.0–52.0)
Hemoglobin: 17.1 g/dL — ABNORMAL HIGH (ref 13.0–17.0)
Immature Granulocytes: 0 %
Lymphocytes Relative: 19 %
Lymphs Abs: 1.9 10*3/uL (ref 0.7–4.0)
MCH: 28.8 pg (ref 26.0–34.0)
MCHC: 33.3 g/dL (ref 30.0–36.0)
MCV: 86.4 fL (ref 80.0–100.0)
Monocytes Absolute: 0.6 10*3/uL (ref 0.1–1.0)
Monocytes Relative: 6 %
Neutro Abs: 7.7 10*3/uL (ref 1.7–7.7)
Neutrophils Relative %: 75 %
Platelets: 271 10*3/uL (ref 150–400)
RBC: 5.94 MIL/uL — ABNORMAL HIGH (ref 4.22–5.81)
RDW: 12.4 % (ref 11.5–15.5)
WBC: 10.3 10*3/uL (ref 4.0–10.5)
nRBC: 0 % (ref 0.0–0.2)

## 2019-04-27 LAB — BASIC METABOLIC PANEL
Anion gap: 10 (ref 5–15)
BUN: 11 mg/dL (ref 6–20)
CO2: 20 mmol/L — ABNORMAL LOW (ref 22–32)
Calcium: 9.3 mg/dL (ref 8.9–10.3)
Chloride: 111 mmol/L (ref 98–111)
Creatinine, Ser: 0.84 mg/dL (ref 0.61–1.24)
GFR calc Af Amer: >60 mL/min (ref >60)
GFR calc non Af Amer: >60 mL/min (ref >60)
Glucose, Bld: 95 mg/dL (ref 70–99)
Potassium: 3.6 mmol/L (ref 3.5–5.1)
Sodium: 141 mmol/L (ref 135–145)

## 2019-04-27 MED ORDER — IBUPROFEN 600 MG PO TABS: 600.0000 mg | ORAL_TABLET | Freq: Four times a day (QID) | ORAL | 0 refills | Status: DC | PRN

## 2019-04-27 MED ORDER — HYDROMORPHONE HCL 1 MG/ML IJ SOLN
1.0000 mg | Freq: Once | INTRAMUSCULAR | Status: AC
  Administered 2019-04-27: 1 mg via INTRAVENOUS
  Filled 2019-04-27: qty 1

## 2019-04-27 MED ORDER — CEPHALEXIN 500 MG PO CAPS: 500.0000 mg | ORAL_CAPSULE | Freq: Four times a day (QID) | ORAL | 0 refills | Status: DC

## 2019-04-27 MED ORDER — LIDOCAINE-EPINEPHRINE (PF) 2 %-1:200000 IJ SOLN: 20.0000 mL | Freq: Once | INTRAMUSCULAR | Status: DC

## 2019-04-27 MED ORDER — TETANUS-DIPHTH-ACELL PERTUSSIS 5-2.5-18.5 LF-MCG/0.5 IM SUSP
0.5000 mL | Freq: Once | INTRAMUSCULAR | Status: AC
  Administered 2019-04-27: 0.5 mL via INTRAMUSCULAR
  Filled 2019-04-27: qty 0.5

## 2019-04-27 MED ORDER — ACETAMINOPHEN 500 MG PO TABS: 500.0000 mg | ORAL_TABLET | Freq: Four times a day (QID) | ORAL | 0 refills | Status: DC | PRN

## 2019-04-27 MED ORDER — CEFAZOLIN SODIUM-DEXTROSE 1-4 GM/50ML-% IV SOLN
1.0000 g | Freq: Once | INTRAVENOUS | Status: AC
  Administered 2019-04-27: 1 g via INTRAVENOUS
  Filled 2019-04-27: qty 50

## 2019-04-27 MED ORDER — LIDOCAINE-EPINEPHRINE (PF) 1 %-1:200000 IJ SOLN
30.0000 mL | Freq: Once | INTRAMUSCULAR | Status: DC
  Filled 2019-04-27: qty 30

## 2019-04-27 MED ORDER — FENTANYL CITRATE (PF) 100 MCG/2ML IJ SOLN
50.0000 ug | Freq: Once | INTRAMUSCULAR | Status: AC
  Administered 2019-04-27: 50 ug via INTRAVENOUS
  Filled 2019-04-27: qty 2

## 2019-04-27 NOTE — ED Triage Notes (Signed)
Patient presents to the ED via [facility] due to a laceration to right forearm sustained from tripping over some steps into a glass door at this home.  Patient has received a total of 8mg  IV Morphine given by EMS.

## 2019-04-27 NOTE — Discharge Instructions (Addendum)
1. Medications: Please take all of your antibiotics until finished!   Take your antibiotics with food.  Common side effects of antibiotics include nausea, vomiting, abdominal discomfort, and diarrhea. You may help offset some of this with probiotics which you can buy or get in yogurt. Do not eat  or take the probiotics until 2 hours after your antibiotic.    Alternate 600 mg of ibuprofen and [phone] mg of Tylenol every 3-6 hours as needed for pain. Do not exceed 4000 mg of Tylenol daily.  Take ibuprofen with food to avoid upset stomach issues. 2. Treatment: ice for swelling, keep wound clean with warm soap and water and keep bandage dry, do not submerge in water for 24 hours 3. Follow Up: Go to Dr. Carlos Levering office at 8 AM on Thursday.  I would call the office tomorrow to confirm the appointment.  Tell them that I spoke with Dr. Amanda Pea in the emergency department and he said to go to the office on Thursday at 8 AM.  You have shards of glass in the arm that will need to be removed.  Return to the emergency department sooner if any concerning signs or symptoms develop such as high fevers, redness, drainage of pus from the wound, or swelling.   WOUND CARE  Keep area clean and dry for 24 hours. Do not remove bandage, if applied.  After 24 hours, remove bandage and wash wound gently with mild soap and warm water. Reapply a new bandage after cleaning wound, if directed.   Continue daily cleansing with soap and water until stitches/staples are removed.  Do not apply any ointments or creams to the wound while stitches/staples are in place, as this may cause delayed healing. Return if you experience any of the following signs of infection: Swelling, redness, pus drainage, streaking, fever >101.0 F  Return if you experience excessive bleeding that does not stop after 15-20 minutes of constant, firm pressure.

## 2019-04-27 NOTE — ED Provider Notes (Signed)
[facility] EMERGENCY DEPARTMENT Provider Note   CSN: [id number] Arrival date & time: 04/27/19  1543     History Chief Complaint  Patient presents with   Laceration    right forearm    Francisco Rosales is a 29 y.o. male with history of alcohol abuse, tobacco abuse presents for evaluation of acute onset, persistent wounds to the right forearm sustained around 1 PM today.  He reports that he tripped landed forward with his outstretched fist on a storm glass door, causing the glass to shatter.  He reports severe pain around the wounds, worsens with palpation.  He notes shards of glass within the wounds.  Pain worsens with movement of his hand.  He denies numbness or weakness of extremity.  He is left-hand dominant.  He is unsure if his tetanus is up-to-date.  The history is provided by the patient.       Past Medical History:  Diagnosis Date   Substance abuse (HCC)    alcoholic    Patient Active Problem List   Diagnosis Date Noted   Hematochezia 03/06/2016   Tobacco abuse 03/06/2016   Alcohol abuse 03/06/2016   Bilious vomiting with nausea 03/06/2016   Immunization refused 03/06/2016    Past Surgical History:  Procedure Laterality Date   addenoidectomy     TONSILLECTOMY         Family History  Problem Relation Age of Onset   Diabetes Maternal Aunt     Social History   Tobacco Use   Smoking status: Former Smoker    Packs/day: 0.50    Types: Cigarettes   Smokeless tobacco: Current User    Types: Chew   Tobacco comment: trying to quit  Substance Use Topics   Alcohol use: No    Comment: recovered alcoholic   Drug use: No    Home Medications Prior to Admission medications   Medication Sig Start Date End Date Taking? Authorizing Provider  acetaminophen (TYLENOL) 500 MG tablet Take 1 tablet (500 mg total) by mouth every 6 (six) hours as needed. 04/27/19   Luevenia Maxin, Jeromie Gainor A, PA-C  cephALEXin (KEFLEX) 500 MG capsule Take 1 capsule (500 mg total) by mouth 4  (four) times daily for 5 days. 04/27/19 05/02/19  Michela Pitcher A, PA-C  ibuprofen (ADVIL) 600 MG tablet Take 1 tablet (600 mg total) by mouth every 6 (six) hours as needed. 04/27/19   Luevenia Maxin, Cinch Ormond A, PA-C  ranitidine (ZANTAC) 150 MG tablet Take 150 mg by mouth 2 (two) times daily as needed for heartburn.    [provider]  sulfamethoxazole-trimethoprim (BACTRIM DS,SEPTRA DS) 800-160 MG tablet Take 1 tablet by mouth 2 (two) times daily. 07/09/17   Aliene Beams, MD  triamcinolone cream (KENALOG) 0.1 % Apply 1 application topically 2 (two) times daily. 07/21/17   Aliene Beams, MD    Allergies    Patient has no known allergies.  Review of Systems   Review of Systems  Constitutional: Negative for fever.  Skin: Positive for wound.  Neurological: Negative for weakness and numbness.    Physical Exam Updated Vital Signs BP 128/83 (BP Location: Left Arm)    Pulse (!) 59    Temp 98.6 F (37 C) (Oral)    Resp 20    Ht 6' (1.829 m)    Wt 81.6 kg    SpO2 99%    BMI 24.41 kg/m   Physical Exam Vitals and nursing note reviewed.  Constitutional:      General: He is not in  acute distress.    Appearance: He is well-developed.  HENT:     Head: Normocephalic and atraumatic.  Eyes:     General:        Right eye: No discharge.        Left eye: No discharge.     Conjunctiva/sclera: Conjunctivae normal.  Neck:     Vascular: No JVD.     Trachea: No tracheal deviation.  Cardiovascular:     Rate and Rhythm: Normal rate.     Pulses: Normal pulses.     Comments: 2+ radial pulses bilaterally Pulmonary:     Effort: Pulmonary effort is normal.  Abdominal:     General: There is no distension.  Musculoskeletal:     Comments: See below images.  Patient with multiple lacerations to the right forearm, bleeding mostly controlled.  There is exposed fascia and underlying muscle belly.  There are also shards of glass noted to the lacerations.  5/5 strength of BUE major muscle groups with flexion and  extension against resistance including the wrist and digits.  Good grip strength bilaterally.  Skin:    General: Skin is warm and dry.     Findings: No erythema.  Neurological:     Mental Status: He is alert.     Comments: Sensation intact to light touch of bilateral upper extremities  Psychiatric:        Behavior: Behavior normal.          ED Results / Procedures / Treatments   Labs (all labs ordered are listed, but only abnormal results are displayed) Labs Reviewed  CBC WITH DIFFERENTIAL/PLATELET - Abnormal; Notable for the following components:      Result Value   RBC 5.94 (*)    Hemoglobin 17.1 (*)    All other components within normal limits  BASIC METABOLIC PANEL - Abnormal; Notable for the following components:   CO2 20 (*)    All other components within normal limits    EKG None  Radiology DG Forearm Right  Result Date: 04/27/2019 CLINICAL DATA:  Right forearm laceration EXAM: RIGHT FOREARM - 2 VIEW COMPARISON:  None. FINDINGS: There is no evidence of fracture or other focal bone lesions. Soft tissue defect over the volar aspect of the forearm compatible with history of laceration. There are multiple rectangular and smaller irregularly shaped radiopaque foreign bodies within the soft tissues of the volar and radial aspects of the forearm. The largest is a rectangular density measuring 2.8 x 0.3 cm. There are at least 7 discrete foreign bodies measuring at least 0.3 cm in size. Additional tiny millimetric punctate densities at the level of the skin laceration. IMPRESSION: 1. No acute osseous abnormality. 2. Multiple radiopaque foreign bodies within the soft tissues of the forearm as described above. The largest is a rectangular density measuring 2.8 x 0.3 cm in size. Electronically Signed   By: Duanne Guess D.O.   On: 04/27/2019 16:32    Procedures .Marland KitchenLaceration Repair  Date/Time: 04/27/2019 8:48 PM Performed by: Jeanie Sewer, PA-C Authorized by: Jeanie Sewer,  PA-C   Consent:    Consent obtained:  Verbal   Consent given by:  Patient   Risks discussed:  Infection, need for additional repair, pain, poor cosmetic result and poor wound healing   Alternatives discussed:  No treatment and delayed treatment Universal protocol:    Procedure explained and questions answered to patient or proxy's satisfaction: yes     Relevant documents present and verified: yes  Test results available and properly labeled: yes     Imaging studies available: yes     Required blood products, implants, devices, and special equipment available: yes     Site/side marked: yes     Immediately prior to procedure, a time out was called: yes     Patient identity confirmed:  Verbally with patient Anesthesia (see MAR for exact dosages):    Anesthesia method:  Local infiltration   Local anesthetic:  Lidocaine 2% WITH epi Laceration details:    Location:  Shoulder/arm   Shoulder/arm location:  R lower arm   Length (cm):  5   Depth (mm):  6 Pre-procedure details:    Preparation:  Patient was prepped and draped in usual sterile fashion and imaging obtained to evaluate for foreign bodies Exploration:    Hemostasis achieved with:  Direct pressure   Wound exploration: wound explored through full range of motion     Wound extent: fascia violated, foreign bodies/material and muscle damage     Wound extent: no underlying fracture noted     Foreign bodies/material:  Yes; glass   Contaminated: yes   Treatment:    Area cleansed with:  Betadine and saline   Amount of cleaning:  Extensive   Irrigation solution:  Sterile saline   Irrigation method:  Pressure wash   Visualized foreign bodies/material removed: yes   Mucous membrane repair:    Suture size:  4-0   Suture material:  Vicryl   Suture technique:  Simple interrupted   Number of sutures:  3 Skin repair:    Repair method:  Sutures   Suture size:  4-0   Suture material:  Prolene   Suture technique:  Simple interrupted    Number of sutures:  5 Approximation:    Approximation:  Loose Post-procedure details:    Dressing:  Sterile dressing and non-adherent dressing   Patient tolerance of procedure:  Tolerated well, no immediate complications .Marland KitchenLaceration Repair  Date/Time: 04/27/2019 8:51 PM Performed by: Jeanie Sewer, PA-C Authorized by: Jeanie Sewer, PA-C   Consent:    Consent obtained:  Verbal   Consent given by:  Patient   Risks discussed:  Infection, pain, poor cosmetic result, poor wound healing and retained foreign body Anesthesia (see MAR for exact dosages):    Anesthesia method:  Local infiltration   Local anesthetic:  Lidocaine 2% WITH epi Laceration details:    Location:  Shoulder/arm   Shoulder/arm location:  R lower arm   Length (cm):  2.6   Depth (mm):  4 Repair type:    Repair type:  Intermediate Pre-procedure details:    Preparation:  Patient was prepped and draped in usual sterile fashion and imaging obtained to evaluate for foreign bodies Exploration:    Hemostasis achieved with:  Direct pressure   Wound exploration: wound explored through full range of motion     Wound extent: foreign bodies/material     Wound extent: no underlying fracture noted     Foreign bodies/material:  Glass   Contaminated: yes   Treatment:    Area cleansed with:  Betadine and saline   Amount of cleaning:  Extensive   Irrigation method:  Pressure wash Skin repair:    Repair method:  Sutures   Suture size:  4-0   Suture material:  Prolene   Suture technique:  Simple interrupted   Number of sutures:  2 Approximation:    Approximation:  Loose Post-procedure details:    Dressing:  Sterile dressing and  non-adherent dressing   Patient tolerance of procedure:  Tolerated well, no immediate complications .Marland Kitchen.Laceration Repair  Date/Time: 04/27/2019 8:52 PM Performed by: Jeanie SewerFawze, Dorwin Fitzhenry A, PA-C Authorized by: Jeanie SewerFawze, Michille Mcelrath A, PA-C   Consent:    Consent obtained:  Verbal   Consent given by:  Patient    Risks discussed:  Infection, need for additional repair, pain, poor cosmetic result, poor wound healing and retained foreign body   Alternatives discussed:  No treatment and delayed treatment Universal protocol:    Procedure explained and questions answered to patient or proxy's satisfaction: yes     Relevant documents present and verified: yes     Test results available and properly labeled: yes     Imaging studies available: yes     Required blood products, implants, devices, and special equipment available: yes     Site/side marked: yes     Immediately prior to procedure, a time out was called: yes     Patient identity confirmed:  Verbally with patient Anesthesia (see MAR for exact dosages):    Anesthesia method:  Local infiltration   Local anesthetic:  Lidocaine 2% WITH epi Laceration details:    Location:  Shoulder/arm   Shoulder/arm location:  R lower arm   Length (cm):  4   Depth (mm):  6 Repair type:    Repair type:  Intermediate Pre-procedure details:    Preparation:  Imaging obtained to evaluate for foreign bodies and patient was prepped and draped in usual sterile fashion Exploration:    Hemostasis achieved with:  Direct pressure   Wound exploration: wound explored through full range of motion     Wound extent: areolar tissue violated and foreign bodies/material     Contaminated: yes   Treatment:    Area cleansed with:  Saline and Betadine   Amount of cleaning:  Extensive   Irrigation solution:  Sterile saline   Irrigation method:  Pressure wash   Visualized foreign bodies/material removed: no   Skin repair:    Repair method:  Sutures   Suture size:  4-0   Suture material:  Prolene   Suture technique:  Simple interrupted   Number of sutures:  3 Approximation:    Approximation:  Loose Post-procedure details:    Dressing:  Sterile dressing and non-adherent dressing   Patient tolerance of procedure:  Tolerated well, no immediate complications   (including  critical care time)  Medications Ordered in ED Medications  lidocaine-EPINEPHrine (XYLOCAINE W/EPI) 2 %-1:200000 (PF) injection 20 mL (has no administration in time range)  Tdap (BOOSTRIX) injection 0.5 mL (0.5 mLs Intramuscular Given 04/27/19 1622)  ceFAZolin (ANCEF) IVPB 1 g/50 mL premix (0 g Intravenous Stopped 04/27/19 1720)  fentaNYL (SUBLIMAZE) injection 50 mcg (50 mcg Intravenous Given 04/27/19 1621)  HYDROmorphone (DILAUDID) injection 1 mg (1 mg Intravenous Given 04/27/19 1815)    ED Course  I have reviewed the triage vital signs and the nursing notes.  Pertinent labs & imaging results that were available during my care of the patient were reviewed by me and considered in my medical decision making (see chart for details).    MDM Rules/Calculators/A&P                      Patient presenting for evaluation of multiple lacerations to the right forearm.  Shards of glass are visible and there also appears to be damage to the fascia and muscle but he is neurovascularly intact and there does not appear to be any evidence  of tendon involvement.  Radiographs show multiple foreign bodies but no acute osseous abnormality.  CONSULT: Spoke with Dr. Amedeo Plenty with hand surgery who recommends loosely closing the wounds after extensively irrigating and attempting to remove as many of the foreign bodies as possible.  He will see the patient in follow-up in the office on Thursday at 8 AM for reevaluation.  I also recommend sending the patient home on a course of Keflex.  Patient received IV Ancef in the ED.  His pain was controlled in the ED.  The wounds were extensively irrigated and base of the wounds were visualized in a bloodless field.  I was able to remove 2 shards of glass but was unable to visualize or remove any more.  2 of his lacerations appear to be quite deep.  His tetanus was updated in the ED.  He tolerated suture repair with loose closure.  Sterile dressing was applied.  Discussed wound care  with patient and his significant other who was available on the phone.  He understands to follow-up with Dr. Vanetta Shawl office in 2 days at 8 AM for reevaluation.  Discussed strict ED return precautions.  Patient and significant other verbalized understanding of and agreement with plan and patient is stable for discharge home at this time.   Final Clinical Impression(s) / ED Diagnoses Final diagnoses:  Laceration of multiple sites of right upper extremity, initial encounter  Foreign body in right forearm, initial encounter    Rx / DC Orders ED Discharge Orders         Ordered    cephALEXin (KEFLEX) 500 MG capsule  4 times daily     04/27/19 2041    acetaminophen (TYLENOL) 500 MG tablet  Every 6 hours PRN     04/27/19 2041    ibuprofen (ADVIL) 600 MG tablet  Every 6 hours PRN     04/27/19 2041           Debroah Baller 04/27/19 2055    Nat Christen, MD 04/28/19 2321

## 2019-04-30 ENCOUNTER — Other Ambulatory Visit (HOSPITAL_COMMUNITY)
Admission: RE | Admit: 2019-04-30 | Discharge: 2019-04-30 | Disposition: A | Discharge status: Home / Self Care | Payer: Medicaid Other | Source: Ambulatory Visit | Attending: Orthopedic Surgery | Admitting: Orthopedic Surgery

## 2019-04-30 ENCOUNTER — Encounter (HOSPITAL_COMMUNITY)
Admission: RE | Admit: 2019-04-30 | Discharge: 2019-04-30 | Disposition: A | Discharge status: Home / Self Care | Payer: PRIVATE HEALTH INSURANCE | Source: Ambulatory Visit | Attending: Orthopedic Surgery | Admitting: Orthopedic Surgery

## 2019-04-30 ENCOUNTER — Encounter (HOSPITAL_COMMUNITY): Payer: Self-pay

## 2019-04-30 ENCOUNTER — Other Ambulatory Visit: Payer: Self-pay

## 2019-04-30 DIAGNOSIS — Z01812 Encounter for preprocedural laboratory examination: Secondary | ICD-10-CM | POA: Insufficient documentation

## 2019-04-30 DIAGNOSIS — Z20822 Contact with and (suspected) exposure to covid-19: Secondary | ICD-10-CM | POA: Insufficient documentation

## 2019-04-30 LAB — COMPREHENSIVE METABOLIC PANEL
ALT: 46 U/L — ABNORMAL HIGH (ref 0–44)
AST: 30 U/L (ref 15–41)
Albumin: 4.6 g/dL (ref 3.5–5.0)
Alkaline Phosphatase: 65 U/L (ref 38–126)
Anion gap: 10 (ref 5–15)
BUN: 12 mg/dL (ref 6–20)
CO2: 24 mmol/L (ref 22–32)
Calcium: 9.4 mg/dL (ref 8.9–10.3)
Chloride: 106 mmol/L (ref 98–111)
Creatinine, Ser: 0.92 mg/dL (ref 0.61–1.24)
GFR calc Af Amer: >60 mL/min (ref >60)
GFR calc non Af Amer: >60 mL/min (ref >60)
Glucose, Bld: 90 mg/dL (ref 70–99)
Potassium: 4.1 mmol/L (ref 3.5–5.1)
Sodium: 140 mmol/L (ref 135–145)
Total Bilirubin: 1.2 mg/dL (ref 0.3–1.2)
Total Protein: 7 g/dL (ref 6.5–8.1)

## 2019-04-30 LAB — SARS CORONAVIRUS 2 (TAT 6-24 HRS): SARS Coronavirus 2: NEGATIVE

## 2019-04-30 NOTE — Progress Notes (Signed)
Your procedure is scheduled on Saturday March 27  Report to [facility] Main Entrance "A" at 05:30 A.M., and check in at the Admitting office.  Call this number if you have problems the morning of surgery: [phone]  Call [phone] if you have any questions prior to your surgery date Monday-Friday 8am-4pm   Remember: Do not eat or drink after midnight the night before your surgery    Mo medicines morning of surgery  As of today, STOP taking any Aspirin (unless otherwise instructed by your surgeon), Aleve, Naproxen, Ibuprofen, Motrin, Advil, Goody's, BC's, all herbal medications, fish oil, and all vitamins.    The Morning of Surgery  Do not wear jewelry  Do not wear lotions, powders, colognes, or deodorant  Do not shave 48 hours prior to surgery.    Do not bring valuables to the hospital.  [facility] is not responsible for any belongings or valuables.  If you are a smoker, DO NOT Smoke 24 hours prior to surgery  If you wear a CPAP at night please bring your mask the morning of surgery   Remember that you must have someone to transport you home after your surgery, and remain with you for 24 hours if you are discharged the same day.   Please bring cases for contacts, glasses, hearing aids, dentures or bridgework because it cannot be worn into surgery.    Leave your suitcase in the car.  After surgery it may be brought to your room.  For patients admitted to the hospital, discharge time will be determined by your treatment team.  Patients discharged the day of surgery will not be allowed to drive home.    Special instructions:   Francisco Rosales- Preparing For Surgery  Before surgery, you can play an important role. Because skin is not sterile, your skin needs to be as free of germs as possible. You can reduce the number of germs on your skin by washing with CHG (chlorahexidine gluconate) Soap before surgery.  CHG is an antiseptic cleaner which kills germs and bonds with  the skin to continue killing germs even after washing.    Oral Hygiene is also important to reduce your risk of infection.  Remember - BRUSH YOUR TEETH THE MORNING OF SURGERY WITH YOUR REGULAR TOOTHPASTE  Please do not use if you have an allergy to CHG or antibacterial soaps. If your skin becomes reddened/irritated stop using the CHG.  Do not shave (including legs and underarms) for at least 48 hours prior to first CHG shower. It is OK to shave your face.  Please follow these instructions carefully.   1. Shower the NIGHT BEFORE SURGERY and the MORNING OF SURGERY with CHG Soap.   2. If you chose to wash your hair and body, wash as usual with your normal shampoo and body-wash/soap.  3. Rinse your hair and body thoroughly to remove the shampoo and soap.  4. Apply CHG directly to the skin (ONLY FROM THE NECK DOWN) and wash gently with a scrungie or a clean washcloth.   5. Do not use on open wounds or open sores. Avoid contact with your eyes, ears, mouth and genitals (private parts). Wash Face and genitals (private parts)  with your normal soap.   6. Wash thoroughly, paying special attention to the area where your surgery will be performed.  7. Thoroughly rinse your body with warm water from the neck down.  8. DO NOT shower/wash with your normal soap after using and rinsing off the  CHG Soap.  9. Pat yourself dry with a CLEAN TOWEL.  10. Wear CLEAN PAJAMAS to bed the night before surgery  11. Place CLEAN SHEETS on your bed the night of your first shower and DO NOT SLEEP WITH PETS.  12. Wear comfortable clothes the morning of surgery.     Day of Surgery:  Please shower the morning of surgery with the CHG soap Do not apply any deodorants/lotions. Please wear clean clothes to the hospital/surgery center.   Remember to brush your teeth WITH YOUR REGULAR TOOTHPASTE.   Please read over the following fact sheets that you were given.

## 2019-04-30 NOTE — Progress Notes (Signed)
Called pt and instructed him to arrive in the AM at 6 AM (not 5:30) and go to the ED Registration to be checked in for surgery. I explained to him that once he is checked in, someone will escort him to the Short Stay unit.

## 2019-04-30 NOTE — Progress Notes (Signed)
PCP - pt denies Cardiologist - n/a  Chest x-ray - n/a EKG - n/a Stress Test - n/a ECHO - n/a Cardiac Cath - n/a   COVID TEST- 04/30/19  Coronavirus Screening  Have you experienced the following symptoms:  Cough yes/no: No Fever (>100.55F)  yes/no: No Runny nose yes/no: No Sore throat yes/no: No Difficulty breathing/shortness of breath  yes/no: No  Have you or a family member traveled in the last 14 days and where? yes/no: No   If the patient indicates "YES" to the above questions, their PAT will be rescheduled to limit the exposure to others and, the surgeon will be notified. THE PATIENT WILL NEED TO BE ASYMPTOMATIC FOR 14 DAYS.   If the patient is not experiencing any of these symptoms, the PAT nurse will instruct them to NOT bring anyone with them to their appointment since they may have these symptoms or traveled as well.   Please remind your patients and families that hospital visitation restrictions are in effect and the importance of the restrictions.     Anesthesia review: n/a  Patient denies shortness of breath, fever, cough and chest pain at PAT appointment   All instructions explained to the patient, with a verbal understanding of the material. Patient agrees to go over the instructions while at home for a better understanding. Patient also instructed to self quarantine after being tested for COVID-19. The opportunity to ask questions was provided.

## 2019-04-30 NOTE — Anesthesia Preprocedure Evaluation (Addendum)
Anesthesia Evaluation  Patient identified by MRN, date of birth, ID band Patient awake    Reviewed: Allergy & Precautions, NPO status , Patient's Chart, lab work & pertinent test results  Airway Mallampati: I  TM Distance: >3 FB Neck ROM: Full    Dental  (+) Poor Dentition, Dental Advisory Given, Chipped   Pulmonary neg pulmonary ROS, former smoker,    Pulmonary exam normal breath sounds clear to auscultation       Cardiovascular negative cardio ROS Normal cardiovascular exam Rhythm:Regular Rate:Normal     Neuro/Psych PSYCHIATRIC DISORDERS negative neurological ROS     GI/Hepatic negative GI ROS, Neg liver ROS,   Endo/Other  negative endocrine ROS  Renal/GU negative Renal ROS     Musculoskeletal negative musculoskeletal ROS (+)   Abdominal   Peds  Hematology negative hematology ROS (+)   Anesthesia Other Findings   Reproductive/Obstetrics                            Anesthesia Physical Anesthesia Plan  ASA: II  Anesthesia Plan: General   Post-op Pain Management:    Induction: Intravenous  PONV Risk Score and Plan: 3 and Ondansetron, Dexamethasone, Treatment may vary due to age or medical condition and Midazolam  Airway Management Planned: LMA  Additional Equipment:   Intra-op Plan:   Post-operative Plan: Extubation in OR  Informed Consent: I have reviewed the patients History and Physical, chart, labs and discussed the procedure including the risks, benefits and alternatives for the proposed anesthesia with the patient or authorized representative who has indicated his/her understanding and acceptance.     Dental advisory given  Plan Discussed with: CRNA  Anesthesia Plan Comments:        Anesthesia Quick Evaluation

## 2019-05-01 ENCOUNTER — Ambulatory Visit (HOSPITAL_COMMUNITY)
Admission: RE | Admit: 2019-05-01 | Discharge: 2019-05-01 | Disposition: A | Discharge status: Home / Self Care | Payer: Self-pay | Attending: Orthopedic Surgery | Admitting: Orthopedic Surgery

## 2019-05-01 ENCOUNTER — Encounter (HOSPITAL_COMMUNITY)
Admission: RE | Disposition: A | Discharge status: Home / Self Care | Payer: Self-pay | Source: Home / Self Care | Attending: Orthopedic Surgery

## 2019-05-01 ENCOUNTER — Encounter (HOSPITAL_COMMUNITY): Payer: Self-pay | Admitting: Orthopedic Surgery

## 2019-05-01 ENCOUNTER — Ambulatory Visit (HOSPITAL_COMMUNITY): Payer: Self-pay | Admitting: Anesthesiology

## 2019-05-01 DIAGNOSIS — Z87891 Personal history of nicotine dependence: Secondary | ICD-10-CM | POA: Insufficient documentation

## 2019-05-01 DIAGNOSIS — S51821A Laceration with foreign body of right forearm, initial encounter: Secondary | ICD-10-CM | POA: Insufficient documentation

## 2019-05-01 DIAGNOSIS — X58XXXA Exposure to other specified factors, initial encounter: Secondary | ICD-10-CM | POA: Insufficient documentation

## 2019-05-01 HISTORY — PX: I & D EXTREMITY: SHX5045

## 2019-05-01 SURGERY — IRRIGATION AND DEBRIDEMENT EXTREMITY
Anesthesia: General | Site: Arm Lower | Laterality: Right

## 2019-05-01 MED ORDER — LACTATED RINGERS IV SOLN: INTRAVENOUS | Status: DC | PRN

## 2019-05-01 MED ORDER — CEFAZOLIN SODIUM-DEXTROSE 2-4 GM/100ML-% IV SOLN
INTRAVENOUS | Status: AC
  Filled 2019-05-01: qty 100

## 2019-05-01 MED ORDER — PROPOFOL 10 MG/ML IV BOLUS
INTRAVENOUS | Status: DC | PRN
  Administered 2019-05-01: 200 mg via INTRAVENOUS

## 2019-05-01 MED ORDER — FENTANYL CITRATE (PF) 100 MCG/2ML IJ SOLN
INTRAMUSCULAR | Status: DC | PRN
  Administered 2019-05-01: 25 ug via INTRAVENOUS
  Administered 2019-05-01 (×3): 50 ug via INTRAVENOUS

## 2019-05-01 MED ORDER — LIDOCAINE 2% (20 MG/ML) 5 ML SYRINGE
INTRAMUSCULAR | Status: AC
  Filled 2019-05-01: qty 5

## 2019-05-01 MED ORDER — OXYCODONE HCL 5 MG/5ML PO SOLN: 5.0000 mg | Freq: Once | ORAL | Status: AC | PRN

## 2019-05-01 MED ORDER — VANCOMYCIN HCL IN DEXTROSE 1-5 GM/200ML-% IV SOLN
1000.0000 mg | Freq: Once | INTRAVENOUS | Status: AC
  Administered 2019-05-01: 1000 mg via INTRAVENOUS

## 2019-05-01 MED ORDER — MIDAZOLAM HCL 5 MG/5ML IJ SOLN
INTRAMUSCULAR | Status: DC | PRN
  Administered 2019-05-01: 2 mg via INTRAVENOUS

## 2019-05-01 MED ORDER — HYDROMORPHONE HCL 1 MG/ML IJ SOLN: 0.2500 mg | INTRAMUSCULAR | Status: DC | PRN

## 2019-05-01 MED ORDER — CEFAZOLIN SODIUM-DEXTROSE 2-4 GM/100ML-% IV SOLN: 2.0000 g | INTRAVENOUS | Status: DC

## 2019-05-01 MED ORDER — ONDANSETRON HCL 4 MG/2ML IJ SOLN
INTRAMUSCULAR | Status: DC | PRN
  Administered 2019-05-01: 4 mg via INTRAVENOUS

## 2019-05-01 MED ORDER — SODIUM CHLORIDE 0.9 % IR SOLN
Status: DC | PRN
  Administered 2019-05-01: 1000 mL

## 2019-05-01 MED ORDER — LIDOCAINE HCL (CARDIAC) PF 100 MG/5ML IV SOSY: PREFILLED_SYRINGE | INTRAVENOUS | Status: DC | PRN

## 2019-05-01 MED ORDER — BUPIVACAINE HCL (PF) 0.25 % IJ SOLN
INTRAMUSCULAR | Status: DC | PRN
  Administered 2019-05-01: 20 mL

## 2019-05-01 MED ORDER — DEXAMETHASONE SODIUM PHOSPHATE 10 MG/ML IJ SOLN
INTRAMUSCULAR | Status: AC
  Filled 2019-05-01: qty 1

## 2019-05-01 MED ORDER — ONDANSETRON HCL 4 MG/2ML IJ SOLN
INTRAMUSCULAR | Status: AC
  Filled 2019-05-01: qty 2

## 2019-05-01 MED ORDER — LIDOCAINE 2% (20 MG/ML) 5 ML SYRINGE
INTRAMUSCULAR | Status: DC | PRN
  Administered 2019-05-01: 100 mg via INTRAVENOUS

## 2019-05-01 MED ORDER — VANCOMYCIN HCL IN DEXTROSE 1-5 GM/200ML-% IV SOLN
INTRAVENOUS | Status: AC
  Filled 2019-05-01: qty 200

## 2019-05-01 MED ORDER — PROPOFOL 10 MG/ML IV BOLUS
INTRAVENOUS | Status: AC
  Filled 2019-05-01: qty 40

## 2019-05-01 MED ORDER — SODIUM CHLORIDE 0.9 % IR SOLN
Status: DC | PRN
  Administered 2019-05-01: 3000 mL

## 2019-05-01 MED ORDER — OXYCODONE HCL 5 MG PO TABS: 5.0000 mg | ORAL_TABLET | Freq: Three times a day (TID) | ORAL | 0 refills | Status: AC | PRN

## 2019-05-01 MED ORDER — DEXAMETHASONE SODIUM PHOSPHATE 10 MG/ML IJ SOLN
INTRAMUSCULAR | Status: DC | PRN
  Administered 2019-05-01: 8 mg via INTRAVENOUS

## 2019-05-01 MED ORDER — PROPOFOL 10 MG/ML IV BOLUS: INTRAVENOUS | Status: DC | PRN

## 2019-05-01 MED ORDER — EPHEDRINE 5 MG/ML INJ
INTRAVENOUS | Status: AC
  Filled 2019-05-01: qty 10

## 2019-05-01 MED ORDER — OXYCODONE HCL 5 MG PO TABS
5.0000 mg | ORAL_TABLET | Freq: Once | ORAL | Status: AC | PRN
  Administered 2019-05-01: 5 mg via ORAL

## 2019-05-01 MED ORDER — MIDAZOLAM HCL 2 MG/2ML IJ SOLN
INTRAMUSCULAR | Status: AC
  Filled 2019-05-01: qty 2

## 2019-05-01 MED ORDER — EPHEDRINE SULFATE-NACL 50-0.9 MG/10ML-% IV SOSY
PREFILLED_SYRINGE | INTRAVENOUS | Status: DC | PRN
  Administered 2019-05-01 (×2): 5 mg via INTRAVENOUS

## 2019-05-01 MED ORDER — FENTANYL CITRATE (PF) 250 MCG/5ML IJ SOLN
INTRAMUSCULAR | Status: AC
  Filled 2019-05-01: qty 5

## 2019-05-01 MED ORDER — BUPIVACAINE HCL (PF) 0.25 % IJ SOLN
INTRAMUSCULAR | Status: AC
  Filled 2019-05-01: qty 20

## 2019-05-01 MED ORDER — PROMETHAZINE HCL 25 MG/ML IJ SOLN: 6.2500 mg | INTRAMUSCULAR | Status: DC | PRN

## 2019-05-01 MED ORDER — OXYCODONE HCL 5 MG PO TABS
ORAL_TABLET | ORAL | Status: AC
  Filled 2019-05-01: qty 1

## 2019-05-01 MED ORDER — MEPERIDINE HCL 25 MG/ML IJ SOLN: 6.2500 mg | INTRAMUSCULAR | Status: DC | PRN

## 2019-05-01 SURGICAL SUPPLY — 40 items
BNDG CONFORM 2 STRL LF (GAUZE/BANDAGES/DRESSINGS) IMPLANT
BNDG ELASTIC 4X5.8 VLCR STR LF (GAUZE/BANDAGES/DRESSINGS) ×2 IMPLANT
BNDG GAUZE ELAST 4 BULKY (GAUZE/BANDAGES/DRESSINGS) ×2 IMPLANT
CORD BIPOLAR FORCEPS 12FT (ELECTRODE) ×2 IMPLANT
COVER SURGICAL LIGHT HANDLE (MISCELLANEOUS) ×2 IMPLANT
COVER WAND RF STERILE (DRAPES) ×2 IMPLANT
CUFF TOURN SGL QUICK 18X4 (TOURNIQUET CUFF) ×2 IMPLANT
CUFF TOURN SGL QUICK 24 (TOURNIQUET CUFF)
CUFF TRNQT CYL 24X4X16.5-23 (TOURNIQUET CUFF) IMPLANT
DRAPE C-ARM MINI (DRAPES) ×2 IMPLANT
DRSG ADAPTIC 3X8 NADH LF (GAUZE/BANDAGES/DRESSINGS) ×2 IMPLANT
GAUZE SPONGE 4X4 12PLY STRL (GAUZE/BANDAGES/DRESSINGS) ×2 IMPLANT
GAUZE XEROFORM 1X8 LF (GAUZE/BANDAGES/DRESSINGS) ×2 IMPLANT
GLOVE BIOGEL M 8.0 STRL (GLOVE) IMPLANT
GLOVE SS BIOGEL STRL SZ 8 (GLOVE) ×1 IMPLANT
GLOVE SUPERSENSE BIOGEL SZ 8 (GLOVE) ×1
GOWN STRL REUS W/ TWL LRG LVL3 (GOWN DISPOSABLE) ×1 IMPLANT
GOWN STRL REUS W/ TWL XL LVL3 (GOWN DISPOSABLE) ×2 IMPLANT
GOWN STRL REUS W/TWL LRG LVL3 (GOWN DISPOSABLE) ×1
GOWN STRL REUS W/TWL XL LVL3 (GOWN DISPOSABLE) ×2
KIT BASIN OR (CUSTOM PROCEDURE TRAY) ×2 IMPLANT
KIT TURNOVER KIT B (KITS) ×2 IMPLANT
MANIFOLD NEPTUNE II (INSTRUMENTS) ×2 IMPLANT
NEEDLE HYPO 25GX1X1/2 BEV (NEEDLE) IMPLANT
NS IRRIG 1000ML POUR BTL (IV SOLUTION) ×2 IMPLANT
PACK ORTHO EXTREMITY (CUSTOM PROCEDURE TRAY) ×2 IMPLANT
PAD ARMBOARD 7.5X6 YLW CONV (MISCELLANEOUS) ×2 IMPLANT
PAD CAST 4YDX4 CTTN HI CHSV (CAST SUPPLIES) ×1 IMPLANT
PADDING CAST COTTON 4X4 STRL (CAST SUPPLIES) ×1
SET CYSTO W/LG BORE CLAMP LF (SET/KITS/TRAYS/PACK) ×2 IMPLANT
SOL PREP POV-IOD 4OZ 10% (MISCELLANEOUS) ×4 IMPLANT
SPONGE LAP 4X18 RFD (DISPOSABLE) ×2 IMPLANT
SUT PROLENE 3 0 PS 2 (SUTURE) ×6 IMPLANT
SWAB CULTURE ESWAB REG 1ML (MISCELLANEOUS) IMPLANT
SYR CONTROL 10ML LL (SYRINGE) ×2 IMPLANT
TOWEL GREEN STERILE (TOWEL DISPOSABLE) ×2 IMPLANT
TOWEL GREEN STERILE FF (TOWEL DISPOSABLE) ×2 IMPLANT
TUBE CONNECTING 12X1/4 (SUCTIONS) ×2 IMPLANT
WATER STERILE IRR 1000ML POUR (IV SOLUTION) ×2 IMPLANT
YANKAUER SUCT BULB TIP NO VENT (SUCTIONS) ×2 IMPLANT

## 2019-05-01 NOTE — Discharge Instructions (Signed)
Please elevate your arm and please massage and move your fingers frequently.  You should expect some soreness in your forearm over the next 7 days.  You may stop your antibiotics due to the itching that it was causing you.  We removed all of the glass and performed debridement of the musculotendinous areas nicely today.  Please keep your bandage clean and dry and do not remove your bandage.  We will remove your sutures at the time of your first postoperative appointment.

## 2019-05-01 NOTE — Op Note (Signed)
Operative note 05/01/2019.  Preoperative diagnosis: Laceration right volar forearm with retained foreign body.  Laceration dorsal right forearm with retained foreign body.  Musculotendinous injury right volar and dorsal forearm  Postop diagnosis: The same  Operative procedure: #1 irrigation and debridement skin subtenons tissue muscle and tendon with complicated foreign body removal utilizing radiograph/fluoroscopy right dorsal and volar forearm.  Approximately five large pieces of deeply embedded glass were removed from the patient's forearm #2 dorsal forearm rotation flap necessary for closing purposes of a large gaping wound secondary to glass injury #3 fasciotomy volar and dorsal forearms with exploration and debridement of musculotendinous injury #4 complex closure volar forearm laceration #5 exploration of volar forearm with intact radial artery which was explored and median nerve  Anesthesia General  Surgeon Etan Vasudevan MD  Tourniquet time less than an hour  Estimated blood loss minimal  Indications for the procedure: This patient is a 29 year old male with the above-mentioned diagnosis.  He was triaged outside of [place] at our [facility] location and sent for evaluation and treatment.  The patient understands risk and benefits of surgery and desires to proceed.  He has large amount of glass in his volar and dorsal forearm and soft tissue disarray.  I discussed with the patient all issues risk and benefit profiles etc. with this in mind he desires to proceed.  Operative procedure: Patient was seen by myself and anesthesia and taken to the operative theater he underwent a smooth induction of general anesthetic.  We performed a Hibiclens prescrub followed by 10-minute surgical Betadine scrub and paint.  Once this was complete the patient then underwent evaluation of his dorsal and forearm lacerations.  The dorsal laceration was quite jagged and had imperfect skin bridges with avascular regions.   This necessitated a incision which allowed for rotation flap closure at the end of the case.  I made an incision and ellipsed out/excised a 1 mm rim of tissue.  Following this we performed irrigation debridement of skin subtenons tissue muscle and noted the muscle be encroached upon but no injury to the posterior interosseous nerve.  I perform fasciotomies dorsally.  I removed foreign body dorsally in the form of glass shards as well.  Following this attention was turned towards the volar forearm.  Similarly a large laceration was notable.  I performed irrigation and debridement and excised a 1 mm ellipse of skin.  I did not have to make any step cuts as we did on the dorsal forearm.  The patient had a lot of punctate 1 cm or less injuries to the various regions on the volar forearm which were debrided as well I performed a fasciotomy of the volar forearm.  Following this I pulled out additional glass shards.  Radiograph was brought in to make sure all glass was evacuated.  It was.  There were 5-6 shards of glass which were removed from the dorsal and volar forearms deeply bedded in a complicated area.  Patient had complicated foreign body removal.  Patient had fasciotomy of the volar forearm accomplished as well.  Patient had debridement of the musculotendinous region.  The palmaris longus was injured which was not surprising.  The patient's radial artery was identified traced out and was noted to be intact.  Median nerve looked to be stable.  The patient had copious amounts of irrigation applied at this juncture.  3 L of irrigant were placed through and through all wounds.  Following this we then performed very careful and cautious look to  the area and closure.  The irrigation and debridement portions were performed with the tourniquet deflated.  Hemostasis was secured and the dorsal wound was closed with 3-0 Prolene utilizing a rotation flap closure to get good primary and and closure the volar forearm  was similarly closed with Prolene suture.  Patient had soft compartments no complications and bleeding was well controlled.  I was pleased with the surgical events.  He was given preoperative vancomycin.  He has had some itching with his Keflex and I feel safe to stop this is there were no hard findings of infection.  He will be discharged home on oxycodone as needed pain we will see him in 2 weeks for follow-up.  Discharge instructions were carefully gone over with his family.  Daniah Zaldivar MD

## 2019-05-01 NOTE — Anesthesia Postprocedure Evaluation (Signed)
Anesthesia Post Note  Patient: Francisco Rosales, Francisco Rosales  Procedure(s) Performed: RIGHT FOREARM IRRIGATION AND DEBRIDEMENT REMOVAL OF MULTIPLE FOREIGN BODIES AND FASCIOTOMIES (Right Arm Lower)     Patient location during evaluation: PACU Anesthesia Type: General Level of consciousness: sedated and patient cooperative Pain management: pain level controlled Vital Signs Assessment: post-procedure vital signs reviewed and stable Respiratory status: spontaneous breathing Cardiovascular status: stable Anesthetic complications: no    Last Vitals:  Vitals:   05/01/19 0910 05/01/19 0925  BP: 127/90 125/78  Pulse: (!) 58 61  Resp: (!) 26 15  Temp:  36.8 C  SpO2: 96% 98%    Last Pain:  Vitals:   05/01/19 0925  TempSrc:   PainSc: 6                  Lewie Loron

## 2019-05-01 NOTE — H&P (Signed)
Francisco Rosales is an 29 y.o. male.   Chief Complaint: Right forearm laceration with foreign body and loss of function HPI: Patient presents for irrigation debridement repair is necessary right forearm with foreign body removal extensor and flexor tenolysis tenosynovectomy and repairs as necessary.  Patient presents for evaluation and treatment of the of their upper extremity predicament. The patient denies neck, back, chest or  abdominal pain. The patient notes that they have no lower extremity problems. The patients primary complaint is noted. We are planning surgical care pathway for the upper extremity.  Past Medical History:  Diagnosis Date  . 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 addenoidectomy    . TONSILLECTOMY      Family History  Problem Relation Age of Onset  . Diabetes Maternal Aunt    Social History:  reports that he has quit smoking. His smoking use included cigarettes. He smoked 0.50 packs per day. He has quit using smokeless tobacco.  His smokeless tobacco use included chew. He reports that he does not drink alcohol or use drugs.  Allergies:  Allergies  Allergen Reactions  . Dilaudid [Hydromorphone Hcl]     Agitation during withdrawal  . Keflex [Cephalexin] Itching    Reports as being severe     Medications Prior to Admission  Medication Sig Dispense Refill  . acetaminophen (TYLENOL) 500 MG tablet Take 1 tablet (500 mg total) by mouth every 6 (six) hours as needed. 30 tablet 0  . cephALEXin (KEFLEX) 500 MG capsule Take 1 capsule (500 mg total) by mouth 4 (four) times daily for 5 days. 20 capsule 0  . ibuprofen (ADVIL) 600 MG tablet Take 1 tablet (600 mg total) by mouth every 6 (six) hours as needed. 30 tablet 0  . sulfamethoxazole-trimethoprim (BACTRIM DS,SEPTRA DS) 800-160 MG tablet Take 1 tablet by mouth 2 (two) times daily. (Patient not taking: Reported on 04/29/2019) 20 tablet 0  . triamcinolone cream  (KENALOG) 0.1 % Apply 1 application topically 2 (two) times daily. (Patient not taking: Reported on 04/29/2019) 30 g 0    Results for orders placed or performed during the hospital encounter of 04/30/19 (from the past 48 hour(s))  SARS CORONAVIRUS 2 (TAT 6-24 HRS) Nasopharyngeal Nasopharyngeal Swab     Status: None   Collection Time: 04/30/19 12:48 PM   Specimen: Nasopharyngeal Swab  Result Value Ref Range   SARS Coronavirus 2 NEGATIVE NEGATIVE    Comment: (NOTE) SARS-CoV-2 target nucleic acids are NOT DETECTED. The SARS-CoV-2 RNA is generally detectable in upper and lower respiratory specimens during the acute phase of infection. Negative results do not preclude SARS-CoV-2 infection, do not rule out co-infections with other pathogens, and should not be used as the sole basis for treatment or other patient management decisions. Negative results must be combined with clinical observations, patient history, and epidemiological information. The expected result is Negative. Fact Sheet for Patients: SugarRoll.be Fact Sheet for Healthcare Providers: https://www.woods-mathews.com/ This test is not yet approved or cleared by the [place] FDA and  has been authorized for detection and/or diagnosis of SARS-CoV-2 by FDA under an Emergency Use Authorization (EUA). This EUA will remain  in effect (meaning this test can be used) for the duration of the COVID-19 declaration under Section 56 4(b)(1) of the Act, 21 U.S.C. section 360bbb-3(b)(1), unless the authorization is terminated or revoked sooner. Performed at [facility], [place] [address], [place], [place] [address]  No results found.  Review of Systems  Respiratory: Negative.   Cardiovascular: Negative.   Gastrointestinal: Negative.   Endocrine: Negative.   Genitourinary: Negative.     Blood pressure 116/61, pulse 66, temperature 98.6 F (37 C), temperature source Oral, resp. rate  16, height 6' (1.829 m), weight 78.7 kg, SpO2 100 %. Physical Exam  Laceration right forearm with foreign body retained and significant pain as well as loss of terminal flexion extension due to discomfort.  We are planning extraction of the foreign bodies and repair reconstruction.  The patient is alert and oriented in no acute distress. The patient complains of pain in the affected upper extremity.  The patient is noted to have a normal HEENT exam. Lung fields show equal chest expansion and no shortness of breath. Abdomen exam is nontender without distention. Lower extremity examination does not show any fracture dislocation or blood clot symptoms. Pelvis is stable and the neck and back are stable and nontender. Assessment/Plan   Patient presents for irrigation debridement repair is necessary right forearm with foreign body removal extensor and flexor tenolysis tenosynovectomy and repairs as necessary.  We are planning surgery for your upper extremity. The risk and benefits of surgery to include risk of bleeding, infection, anesthesia,  damage to normal structures and failure of the surgery to accomplish its intended goals of relieving symptoms and restoring function have been discussed in detail. With this in mind we plan to proceed. I have specifically discussed with the patient the pre-and postoperative regime and the dos and don'ts and risk and benefits in great detail. Risk and benefits of surgery also include risk of dystrophy(CRPS), chronic nerve pain, failure of the healing process to go onto completion and other inherent risks of surgery The relavent the pathophysiology of the disease/injury process, as well as the alternatives for treatment and postoperative course of action has been discussed in great detail with the patient who desires to proceed.  We will do everything in our power to help you (the patient) restore function to the upper extremity. It is a pleasure to see this patient  today.  Oletta Cohn III, MD 05/01/2019, 7:26 AM

## 2019-05-01 NOTE — Transfer of Care (Signed)
Immediate Anesthesia Transfer of Care Note  Patient: Engineer, production  Procedure(s) Performed: RIGHT FOREARM IRRIGATION AND DEBRIDEMENT REMOVAL OF MULTIPLE FOREIGN BODIES AND FASCIOTOMIES (Right Arm Lower)  Patient Location: PACU  Anesthesia Type:General  Level of Consciousness: patient cooperative and responds to stimulation  Airway & Oxygen Therapy: Patient Spontanous Breathing  Post-op Assessment: Report given to RN and Post -op Vital signs reviewed and stable  Post vital signs: Reviewed and stable  Last Vitals:  Vitals Value Taken Time  BP 113/74 05/01/19 0855  Temp    Pulse 53 05/01/19 0857  Resp 13 05/01/19 0857  SpO2 100 % 05/01/19 0857  Vitals shown include unvalidated device data.  Last Pain:  Vitals:   05/01/19 0702  TempSrc: Oral  PainSc: 0-No pain         Complications: No apparent anesthesia complications

## 2020-01-17 ENCOUNTER — Emergency Department (HOSPITAL_COMMUNITY): Payer: Self-pay

## 2020-01-17 ENCOUNTER — Encounter (HOSPITAL_COMMUNITY): Payer: Self-pay | Admitting: Emergency Medicine

## 2020-01-17 ENCOUNTER — Emergency Department (HOSPITAL_COMMUNITY)
Admission: EM | Admit: 2020-01-17 | Discharge: 2020-01-17 | Disposition: A | Discharge status: Home / Self Care | Payer: Self-pay | Attending: Emergency Medicine | Admitting: Emergency Medicine

## 2020-01-17 ENCOUNTER — Other Ambulatory Visit: Payer: Self-pay

## 2020-01-17 DIAGNOSIS — Z87891 Personal history of nicotine dependence: Secondary | ICD-10-CM | POA: Insufficient documentation

## 2020-01-17 DIAGNOSIS — Z20822 Contact with and (suspected) exposure to covid-19: Secondary | ICD-10-CM | POA: Insufficient documentation

## 2020-01-17 DIAGNOSIS — R519 Headache, unspecified: Secondary | ICD-10-CM

## 2020-01-17 LAB — CBC WITH DIFFERENTIAL/PLATELET
Abs Immature Granulocytes: 0.01 10*3/uL (ref 0.00–0.07)
Basophils Absolute: 0 10*3/uL (ref 0.0–0.1)
Basophils Relative: 0 %
Eosinophils Absolute: 0.1 10*3/uL (ref 0.0–0.5)
Eosinophils Relative: 2 %
HCT: 48.8 % (ref 39.0–52.0)
Hemoglobin: 16.1 g/dL (ref 13.0–17.0)
Immature Granulocytes: 0 %
Lymphocytes Relative: 40 %
Lymphs Abs: 2.2 10*3/uL (ref 0.7–4.0)
MCH: 28.9 pg (ref 26.0–34.0)
MCHC: 33 g/dL (ref 30.0–36.0)
MCV: 87.5 fL (ref 80.0–100.0)
Monocytes Absolute: 0.3 10*3/uL (ref 0.1–1.0)
Monocytes Relative: 6 %
Neutro Abs: 2.9 10*3/uL (ref 1.7–7.7)
Neutrophils Relative %: 52 %
Platelets: 275 10*3/uL (ref 150–400)
RBC: 5.58 MIL/uL (ref 4.22–5.81)
RDW: 12.8 % (ref 11.5–15.5)
WBC: 5.4 10*3/uL (ref 4.0–10.5)
nRBC: 0 % (ref 0.0–0.2)

## 2020-01-17 LAB — BASIC METABOLIC PANEL
Anion gap: 8 (ref 5–15)
BUN: 12 mg/dL (ref 6–20)
CO2: 26 mmol/L (ref 22–32)
Calcium: 9.3 mg/dL (ref 8.9–10.3)
Chloride: 104 mmol/L (ref 98–111)
Creatinine, Ser: 0.85 mg/dL (ref 0.61–1.24)
GFR, Estimated: >60 mL/min (ref >60)
Glucose, Bld: 88 mg/dL (ref 70–99)
Potassium: 4.4 mmol/L (ref 3.5–5.1)
Sodium: 138 mmol/L (ref 135–145)

## 2020-01-17 LAB — RESP PANEL BY RT-PCR (FLU A&B, COVID) ARPGX2
Influenza A by PCR: NEGATIVE
Influenza B by PCR: NEGATIVE
SARS Coronavirus 2 by RT PCR: NEGATIVE

## 2020-01-17 MED ORDER — METOCLOPRAMIDE HCL 10 MG PO TABS
10.0000 mg | ORAL_TABLET | Freq: Once | ORAL | Status: AC
  Administered 2020-01-17: 10 mg via ORAL
  Filled 2020-01-17: qty 1

## 2020-01-17 MED ORDER — DIPHENHYDRAMINE HCL 25 MG PO CAPS
25.0000 mg | ORAL_CAPSULE | Freq: Once | ORAL | Status: AC
  Administered 2020-01-17: 25 mg via ORAL
  Filled 2020-01-17: qty 1

## 2020-01-17 MED ORDER — SODIUM CHLORIDE 0.9 % IV BOLUS
250.0000 mL | Freq: Once | INTRAVENOUS | Status: AC
  Administered 2020-01-17: 250 mL via INTRAVENOUS

## 2020-01-17 MED ORDER — IOHEXOL 350 MG/ML SOLN
100.0000 mL | Freq: Once | INTRAVENOUS | Status: AC | PRN
  Administered 2020-01-17: 75 mL via INTRAVENOUS

## 2020-01-17 NOTE — ED Notes (Signed)
Patient transported to CT 

## 2020-01-17 NOTE — ED Triage Notes (Signed)
Pt c/o bilateral ear pain and headache that's been going on since 11/29. Pt states he takes ibuprofen and it helps but comes right back.

## 2020-01-17 NOTE — ED Provider Notes (Signed)
[facility] EMERGENCY DEPARTMENT Provider Note   CSN: [id number] Arrival date & time: 01/17/20  1129     History Chief Complaint  Patient presents with  . Headache    Francisco Rosales is a 29 y.o. male history of alcohol abuse.  Patient arrives today for headache onset 2 weeks ago, no clear inciting event headache has been constant throbbing nonradiating primarily in the front of his head.  Minimally relieved by ibuprofen temporarily.  Worsened with bright light.  Denies similar headache in the past.  Associated with difficulty focusing his vision.  Denies fever/chills, fall/injury, neck pain, sore throat, cough/abscess, abdominal pain, nausea/vomiting, numbness/tingling, weakness, difficulty speaking, balance issues or any additional concerns  HPI     Past Medical History:  Diagnosis Date  . Substance abuse ([facility])    alcoholic    Patient Active Problem List   Diagnosis Date Noted  . Hematochezia 03/06/2016  . Tobacco abuse 03/06/2016  . Alcohol abuse 03/06/2016  . Bilious vomiting with nausea 03/06/2016  . Immunization refused 03/06/2016    Past Surgical History:  Procedure Laterality Date  . addenoidectomy    . I & D EXTREMITY Right 05/01/2019   Procedure: RIGHT FOREARM IRRIGATION AND DEBRIDEMENT REMOVAL OF MULTIPLE FOREIGN BODIES AND FASCIOTOMIES;  Surgeon: Dominica Severin, MD;  Location: [facility] OR;  Service: Orthopedics;  Laterality: Right;  60 mins  . TONSILLECTOMY         Family History  Problem Relation Age of Onset  . Diabetes Maternal Aunt     Social History   Tobacco Use  . Smoking status: Former Smoker    Packs/day: 0.50    Types: Cigarettes  . Smokeless tobacco: Former Neurosurgeon    Types: Chew  . Tobacco comment: trying to quit  Substance Use Topics  . Alcohol use: No    Comment: recovered alcoholic  . Drug use: No    Home Medications Prior to Admission medications   Medication Sig Start Date End Date Taking? Authorizing Provider  acetaminophen  (TYLENOL) 500 MG tablet Take 1 tablet (500 mg total) by mouth every 6 (six) hours as needed. 04/27/19   Fawze, Mina A, PA-C  ibuprofen (ADVIL) 600 MG tablet Take 1 tablet (600 mg total) by mouth every 6 (six) hours as needed. 04/27/19   Fawze, Mina A, PA-C  oxyCODONE (ROXICODONE) 5 MG immediate release tablet Take 1 tablet (5 mg total) by mouth every 8 (eight) hours as needed. 05/01/19 04/30/20  Dominica Severin, MD    Allergies    Dilaudid [hydromorphone hcl] and Keflex [cephalexin]  Review of Systems   Review of Systems Ten systems are reviewed and are negative for acute change except as noted in the HPI  Physical Exam Updated Vital Signs BP 125/82 (BP Location: Right Arm)   Pulse 61   Temp 98.6 F (37 C)   Resp 18   Ht 6' (1.829 m)   Wt 81.6 kg   SpO2 99%   BMI 24.41 kg/m   Physical Exam Constitutional:      General: He is not in acute distress.    Appearance: Normal appearance. He is well-developed. He is not ill-appearing or diaphoretic.  HENT:     Head: Normocephalic and atraumatic.  Eyes:     General: Vision grossly intact. Gaze aligned appropriately.     Pupils: Pupils are equal, round, and reactive to light.  Neck:     Trachea: Trachea and phonation normal.  Pulmonary:     Effort: Pulmonary effort is normal.  No respiratory distress.  Abdominal:     General: There is no distension.     Palpations: Abdomen is soft.     Tenderness: There is no abdominal tenderness. There is no guarding or rebound.  Musculoskeletal:        General: Normal range of motion.     Cervical back: Normal range of motion.  Skin:    General: Skin is warm and dry.  Neurological:     Mental Status: He is alert.     GCS: GCS eye subscore is 4. GCS verbal subscore is 5. GCS motor subscore is 6.     Comments: Mental Status: Alert, oriented, thought content appropriate, able to give a coherent history. Speech fluent without evidence of aphasia. Able to follow 2 step commands without  difficulty. Cranial Nerves: II: Peripheral visual fields grossly normal, pupils equal, round, reactive to light III,IV, VI: ptosis not present, slight lag of right eye when EOM moved to patient's left. V,VII: smile symmetric, eyebrows raise symmetric, facial light touch sensation equal VIII: hearing grossly normal to voice X: uvula elevates symmetrically XI: bilateral shoulder shrug symmetric and strong XII: midline tongue extension without fassiculations Motor: Normal tone. 5/5 strength in upper and lower extremities bilaterally including strong and equal grip strength and dorsiflexion/plantar flexion Sensory: Sensation intact to light touch in all extremities.Negative Romberg.  Cerebellar: normal finger-to-nose with bilateral upper extremities. Normal heel-to -shin balance bilaterally of the lower extremity. No pronator drift.  Gait: normal gait and balance CV: distal pulses palpable throughout  Psychiatric:        Behavior: Behavior normal.     ED Results / Procedures / Treatments   Labs (all labs ordered are listed, but only abnormal results are displayed) Labs Reviewed - No data to display  EKG None  Radiology No results found.  Procedures Procedures (including critical care time)  Medications Ordered in ED Medications - No data to display  ED Course  I have reviewed the triage vital signs and the nursing notes.  Pertinent labs & imaging results that were available during my care of the patient were reviewed by me and considered in my medical decision making (see chart for details).  Clinical Course as of 01/17/20 1553  Mon Jan 17, 2020  1517 Dr. Gerilyn Pilgrim [BM]    Clinical Course User Index [BM] Elizabeth Palau   MDM Rules/Calculators/A&P                         [name] is a 29 y.o. male who presents to ED for 2 weeks of new headache and some difficulty concentrating his vision.  Headache worse with bright lights.  Neurologic examination shows  some lag with EOM of the right eye well looking left otherwise within normal limits.  No history of injury trauma fever chills neck stiffness and no pain of the facial cervical or temporal arteries.  Patient appears tired but not ill and in no acute distress.  Discussed case with attending physician Dr. Rubin Payor will obtain CTA of the head for assessment of abnormal EOMs. - CBC within normal limits, no leukocytosis to suggest infection, no anemia. BMP shows no emergent electrolyte derangement, AKI or gap. Covid/influenza panel negative. CT angio head:  IMPRESSION:  No acute intracranial abnormality. Normal vascular imaging of the  head.   3:17 PM: Consulted neurologist Dr. Gerilyn Pilgrim, advised obtaining MRI head without contrast pending no acute findings patient may be discharged and have outpatient follow-up  with his office. - Patient reassessed he is resting comfortably in bed no acute distress reports resolution of headache, he is agreeable to MRI he has no complaints or concerns at this time.  4:35 PM: I was informed by nursing staff that patient has eloped from the emergency department.  I was not informed the patient was attempting to leave prior to him leaving the facility.  Patient has eloped.   Note: Portions of this report may have been transcribed using voice recognition software. Every effort was made to ensure accuracy; however, inadvertent computerized transcription errors may still be present. Final Clinical Impression(s) / ED Diagnoses Final diagnoses:  None    Rx / DC Orders ED Discharge Orders    None       Elizabeth Palau 01/17/20 1637    Benjiman Core, MD 01/18/20 [phone]

## 2020-01-18 ENCOUNTER — Encounter: Payer: Self-pay | Admitting: Emergency Medicine

## 2020-01-18 ENCOUNTER — Ambulatory Visit
Admission: EM | Admit: 2020-01-18 | Discharge: 2020-01-18 | Disposition: A | Discharge status: Home / Self Care | Payer: Medicaid Other | Attending: Emergency Medicine | Admitting: Emergency Medicine

## 2020-01-18 DIAGNOSIS — H6593 Unspecified nonsuppurative otitis media, bilateral: Secondary | ICD-10-CM

## 2020-01-18 MED ORDER — FLUTICASONE PROPIONATE 50 MCG/ACT NA SUSP: 1.0000 | Freq: Every day | NASAL | 0 refills | Status: DC

## 2020-01-18 MED ORDER — PREDNISONE 10 MG PO TABS: 20.0000 mg | ORAL_TABLET | Freq: Every day | ORAL | 0 refills | Status: DC

## 2020-01-18 NOTE — Discharge Instructions (Signed)
Rest and drink plenty of fluids Prescribed Flonase/use as directed Prednisone as prescribed Take medications as directed and to completion Continue to use OTC ibuprofen and/ or tylenol as needed for pain control Follow up with PCP if symptoms persists Return here or go to the ER if you have any new or worsening symptoms

## 2020-01-18 NOTE — ED Provider Notes (Signed)
[facility] CARE CENTER   [id number] 01/18/20 Arrival Time: 1011  CC:EAR PAIN  SUBJECTIVE: History from: patient.  Francisco Rosales is a 29 y.o. male who presented to the urgent care for complaint of bilateral ear pain off-and-on for the past 2 weeks.  Reported recent URI symptoms are resolved almost 1.5 weeks ago.  Patient states the pain is constant and achy in character.  Patient has tried OTC medication without relief.  Symptoms are made worse with lying down.  Denies similar symptoms in the past.  Denies fever, chills, fatigue, sinus pain, rhinorrhea, ear discharge, sore throat, SOB, wheezing, chest pain, nausea, changes in bowel or bladder habits.    ROS: As per HPI.  All other pertinent ROS negative.      Past Medical History:  Diagnosis Date  . Substance abuse (HCC)    alcoholic   Past Surgical History:  Procedure Laterality Date  . addenoidectomy    . I & D EXTREMITY Right 05/01/2019   Procedure: RIGHT FOREARM IRRIGATION AND DEBRIDEMENT REMOVAL OF MULTIPLE FOREIGN BODIES AND FASCIOTOMIES;  Surgeon: Dominica Severin, MD;  Location: [facility] OR;  Service: Orthopedics;  Laterality: Right;  60 mins  . TONSILLECTOMY     Allergies  Allergen Reactions  . Dilaudid [Hydromorphone Hcl]     Agitation during withdrawal  . Keflex [Cephalexin] Itching    Reports as being severe    No current facility-administered medications on file prior to encounter.   Current Outpatient Medications on File Prior to Encounter  Medication Sig Dispense Refill  . acetaminophen (TYLENOL) 500 MG tablet Take 1 tablet (500 mg total) by mouth every 6 (six) hours as needed. 30 tablet 0  . ibuprofen (ADVIL) 600 MG tablet Take 1 tablet (600 mg total) by mouth every 6 (six) hours as needed. 30 tablet 0  . oxyCODONE (ROXICODONE) 5 MG immediate release tablet Take 1 tablet (5 mg total) by mouth every 8 (eight) hours as needed. 20 tablet 0   Social History   Socioeconomic History  . Marital status: Single    Spouse  name: Not on file  . Number of children: Not on file  . Years of education: 40  . Highest education level: Not on file  Occupational History  . Occupation: pre cast concrete  Tobacco Use  . Smoking status: Former Smoker    Packs/day: 0.50    Types: Cigarettes  . Smokeless tobacco: Former Neurosurgeon    Types: Chew  . Tobacco comment: trying to quit  Substance and Sexual Activity  . Alcohol use: No    Comment: recovered alcoholic  . Drug use: No  . Sexual activity: Yes    Birth control/protection: None  Other Topics Concern  . Not on file  Social History Narrative   Lives with mother and brother.   Used to work at [employer].    Works at [employer].    Does not eat fruits or veggies.   Does not smoke.   Dips tobacco.   Enjoys hunting and fishing.    Social Determinants of Health   Financial Resource Strain: Not on file  Food Insecurity: Not on file  Transportation Needs: Not on file  Physical Activity: Not on file  Stress: Not on file  Social Connections: Not on file  Intimate Partner Violence: Not on file   Family History  Problem Relation Age of Onset  . Diabetes Maternal Aunt     OBJECTIVE:  Vitals:   01/18/20 1050  BP: 114/63  Pulse: 64  Resp: 17  Temp: 98.7 F (37.1 C)  TempSrc: Oral  SpO2: 96%     General appearance: alert; appears fatigued HEENT: Ears: EACs clear, TMs with bilateral middle ear effusion, without erythema; Eyes: PERRL, EOMI grossly; Sinuses nontender to palpation; Nose: clear rhinorrhea; Throat: oropharynx mildly erythematous, tonsils 1+ without white tonsillar exudates, uvula midline Neck: supple without LAD Lungs: unlabored respirations, symmetrical air entry; cough: absent; no respiratory distress Heart: regular rate and rhythm.  Radial pulses 2+ symmetrical bilaterally Skin: warm and dry Psychological: alert and cooperative; normal mood and affect  Imaging: No results found.   ASSESSMENT & PLAN:  1. Middle ear effusion,  bilateral     Meds ordered this encounter  Medications  . fluticasone (FLONASE) 50 MCG/ACT nasal spray    Sig: Place 1 spray into both nostrils daily for 7 days.    Dispense:  16 g    Refill:  0  . predniSONE (DELTASONE) 10 MG tablet    Sig: Take 2 tablets (20 mg total) by mouth daily.    Dispense:  15 tablet    Refill:  0    Discharge instructions  Rest and drink plenty of fluids Prescribed Flonase/use as directed Prednisone as prescribed Take medications as directed and to completion Continue to use OTC ibuprofen and/ or tylenol as needed for pain control Follow up with PCP if symptoms persists Return here or go to the ER if you have any new or worsening symptoms   Reviewed expectations re: course of current medical issues. Questions answered. Outlined signs and symptoms indicating need for more acute intervention. Patient verbalized understanding. After Visit Summary given.         Durward Parcel, FNP 01/18/20 1108

## 2020-01-18 NOTE — ED Triage Notes (Signed)
Bilateral ear pain since 11/29.  Went to ED yesterday but left before being seen.  Pt states he needs a work note.

## 2020-02-18 ENCOUNTER — Other Ambulatory Visit: Payer: Self-pay

## 2020-02-18 ENCOUNTER — Encounter: Payer: Self-pay | Admitting: Emergency Medicine

## 2020-02-18 ENCOUNTER — Ambulatory Visit
Admission: EM | Admit: 2020-02-18 | Discharge: 2020-02-18 | Disposition: A | Discharge status: Home / Self Care | Payer: Medicaid Other | Attending: Emergency Medicine | Admitting: Emergency Medicine

## 2020-02-18 DIAGNOSIS — R5383 Other fatigue: Secondary | ICD-10-CM

## 2020-02-18 DIAGNOSIS — J069 Acute upper respiratory infection, unspecified: Secondary | ICD-10-CM

## 2020-02-18 DIAGNOSIS — R062 Wheezing: Secondary | ICD-10-CM

## 2020-02-18 DIAGNOSIS — Z1152 Encounter for screening for COVID-19: Secondary | ICD-10-CM

## 2020-02-18 MED ORDER — BENZONATATE 100 MG PO CAPS: 100.0000 mg | ORAL_CAPSULE | Freq: Three times a day (TID) | ORAL | 0 refills | Status: DC | PRN

## 2020-02-18 MED ORDER — PREDNISONE 10 MG PO TABS: 20.0000 mg | ORAL_TABLET | Freq: Every day | ORAL | 0 refills | Status: DC

## 2020-02-18 MED ORDER — CETIRIZINE HCL 10 MG PO TABS: 10.0000 mg | ORAL_TABLET | Freq: Every day | ORAL | 0 refills | Status: AC

## 2020-02-18 MED ORDER — ALBUTEROL SULFATE HFA 108 (90 BASE) MCG/ACT IN AERS: 1.0000 | INHALATION_SPRAY | Freq: Four times a day (QID) | RESPIRATORY_TRACT | 0 refills | Status: DC | PRN

## 2020-02-18 MED ORDER — ALBUTEROL SULFATE HFA 108 (90 BASE) MCG/ACT IN AERS: 1.0000 | INHALATION_SPRAY | Freq: Four times a day (QID) | RESPIRATORY_TRACT | 0 refills | Status: AC | PRN

## 2020-02-18 MED ORDER — CETIRIZINE HCL 10 MG PO TABS: 10.0000 mg | ORAL_TABLET | Freq: Every day | ORAL | 0 refills | Status: DC

## 2020-02-18 NOTE — ED Provider Notes (Signed)
[facility] CARE CENTER   [id number] 02/18/20 Arrival Time: 0820   CC: COVID symptoms  SUBJECTIVE: History from: patient.  Francisco Rosales is a 30 y.o. male who presented to the urgent care for complaint of fatigue, and nasal congestion, cough for the past 1 week.  Denies sick exposure to COVID, flu or strep.  Denies recent travel.  Has tried OTC medication without relief.  Denies alleviating or aggravating.  Denies previous symptoms in the past.   Denies fever, chills,  sinus pain, rhinorrhea, sore throat, SOB, wheezing, chest pain, nausea, changes in bowel or bladder habits.     ROS: As per HPI.  All other pertinent ROS negative.      Past Medical History:  Diagnosis Date  . Substance abuse (HCC)    alcoholic   Past Surgical History:  Procedure Laterality Date  . addenoidectomy    . I & D EXTREMITY Right 05/01/2019   Procedure: RIGHT FOREARM IRRIGATION AND DEBRIDEMENT REMOVAL OF MULTIPLE FOREIGN BODIES AND FASCIOTOMIES;  Surgeon: Dominica Severin, MD;  Location: [facility] OR;  Service: Orthopedics;  Laterality: Right;  60 mins  . TONSILLECTOMY     Allergies  Allergen Reactions  . Dilaudid [Hydromorphone Hcl]     Agitation during withdrawal  . Keflex [Cephalexin] Itching    Reports as being severe    No current facility-administered medications on file prior to encounter.   Current Outpatient Medications on File Prior to Encounter  Medication Sig Dispense Refill  . acetaminophen (TYLENOL) 500 MG tablet Take 1 tablet (500 mg total) by mouth every 6 (six) hours as needed. 30 tablet 0  . fluticasone (FLONASE) 50 MCG/ACT nasal spray Place 1 spray into both nostrils daily for 7 days. 16 g 0  . ibuprofen (ADVIL) 600 MG tablet Take 1 tablet (600 mg total) by mouth every 6 (six) hours as needed. 30 tablet 0  . oxyCODONE (ROXICODONE) 5 MG immediate release tablet Take 1 tablet (5 mg total) by mouth every 8 (eight) hours as needed. 20 tablet 0   Social History   Socioeconomic History  .  Marital status: Single    Spouse name: Not on file  . Number of children: Not on file  . Years of education: 60  . Highest education level: Not on file  Occupational History  . Occupation: [employer]  Tobacco Use  . Smoking status: Former Smoker    Packs/day: 0.50    Types: Cigarettes  . Smokeless tobacco: Former Neurosurgeon    Types: Chew  . Tobacco comment: trying to quit  Substance and Sexual Activity  . Alcohol use: No    Comment: recovered alcoholic  . Drug use: No  . Sexual activity: Yes    Birth control/protection: None  Other Topics Concern  . Not on file  Social History Narrative   Lives with mother and brother.   Used to work at [employer].    Works at [employer].    Does not eat fruits or veggies.   Does not smoke.   Dips tobacco.   Enjoys hunting and fishing.    Social Determinants of Health   Financial Resource Strain: Not on file  Food Insecurity: Not on file  Transportation Needs: Not on file  Physical Activity: Not on file  Stress: Not on file  Social Connections: Not on file  Intimate Partner Violence: Not on file   Family History  Problem Relation Age of Onset  . Diabetes Maternal Aunt     OBJECTIVE:  Vitals:  02/18/20 0838 02/18/20 0839  BP: (!) 105/54   Pulse: 80   Resp: 18   Temp: 98.1 F (36.7 C)   TempSrc: Oral   SpO2: 95%   Weight:  180 lb (81.6 kg)  Height:  6' (1.829 m)     General appearance: alert; appears fatigued, but nontoxic; speaking in full sentences and tolerating own secretions HEENT: NCAT; Ears: Bilateral middle ear effusion present; Eyes: PERRL.  EOM grossly intact. Sinuses: nontender; Nose: nares patent without rhinorrhea, Throat: oropharynx clear, tonsils non erythematous or enlarged, uvula midline  Neck: supple without LAD Lungs: unlabored respirations, symmetrical air entry; cough: none; bilateral wheezing present  Heart: regular rate and rhythm.  Radial pulses 2+ symmetrical bilaterally Skin: warm and  dry Psychological: alert and cooperative; normal mood and affect  LABS:  No results found for this or any previous visit (from the past 24 hour(s)).   ASSESSMENT & PLAN:  1. Encounter for screening for COVID-19   2. Other fatigue   3. URI with cough and congestion   4. Wheezing     Meds ordered this encounter  Medications  . predniSONE (DELTASONE) 10 MG tablet    Sig: Take 2 tablets (20 mg total) by mouth daily.    Dispense:  15 tablet    Refill:  0  . albuterol (VENTOLIN HFA) 108 (90 Base) MCG/ACT inhaler    Sig: Inhale 1-2 puffs into the lungs every 6 (six) hours as needed for wheezing or shortness of breath.    Dispense:  18 g    Refill:  0  . cetirizine (ZYRTEC ALLERGY) 10 MG tablet    Sig: Take 1 tablet (10 mg total) by mouth daily.    Dispense:  30 tablet    Refill:  0  . benzonatate (TESSALON) 100 MG capsule    Sig: Take 1 capsule (100 mg total) by mouth 3 (three) times daily as needed for cough.    Dispense:  30 capsule    Refill:  0    Discharge Instructions  COVID testing ordered.  It will take between 2-7 days for test results.  Someone will contact you regarding abnormal results.    If you test positive for COVID-19, quarantine.  Stay home for 5 days.  If you have no symptoms or your symptoms are  resolving after 5 days, you can leave your house.  Continue to wear a fitting mask around others for additional 5 days.  If you have a fever continue to stay home until fever resolved  Get plenty of rest and push fluidsh zyrtec for nasal congestion, runny nose, and/or sore throat Tessalon Perles prescribed for cough Prednisone was prescribed for wheezing ProAir was prescribed Use medications daily for symptom relief Use OTC medications like ibuprofen or tylenol as needed fever or pain Call or go to the ED if you have any new or worsening symptoms such as fever, worsening cough, shortness of breath, chest tightness, chest pain, turning blue, changes in mental  status, etc...   Reviewed expectations re: course of current medical issues. Questions answered. Outlined signs and symptoms indicating need for more acute intervention. Patient verbalized understanding. After Visit Summary given.         Durward Parcel, FNP 02/18/20 [phone]

## 2020-02-18 NOTE — Discharge Instructions (Signed)
COVID testing ordered.  It will take between 2-7 days for test results.  Someone will contact you regarding abnormal results.    If you test positive for COVID-19, quarantine.  Stay home for 5 days.  If you have no symptoms or your symptoms are  resolving after 5 days, you can leave your house.  Continue to wear a fitting mask around others for additional 5 days.  If you have a fever continue to stay home until fever resolved  Get plenty of rest and push fluidsh zyrtec for nasal congestion, runny nose, and/or sore throat Tessalon Perles prescribed for cough Prednisone was prescribed for wheezing ProAir was prescribed Use medications daily for symptom relief Use OTC medications like ibuprofen or tylenol as needed fever or pain Call or go to the ED if you have any new or worsening symptoms such as fever, worsening cough, shortness of breath, chest tightness, chest pain, turning blue, changes in mental status, etc..Marland Kitchen

## 2020-02-18 NOTE — ED Triage Notes (Signed)
No energy, congestion since last week

## 2020-02-21 LAB — COVID-19, FLU A+B NAA
Influenza A, NAA: NOT DETECTED
Influenza B, NAA: NOT DETECTED
SARS-CoV-2, NAA: DETECTED — AB

## 2020-07-17 ENCOUNTER — Ambulatory Visit
Admission: EM | Admit: 2020-07-17 | Discharge: 2020-07-17 | Disposition: A | Discharge status: Home / Self Care | Payer: PRIVATE HEALTH INSURANCE | Attending: Family Medicine | Admitting: Family Medicine

## 2020-07-17 ENCOUNTER — Encounter: Payer: Self-pay | Admitting: Emergency Medicine

## 2020-07-17 ENCOUNTER — Other Ambulatory Visit: Payer: Self-pay

## 2020-07-17 DIAGNOSIS — K068 Other specified disorders of gingiva and edentulous alveolar ridge: Secondary | ICD-10-CM

## 2020-07-17 DIAGNOSIS — K047 Periapical abscess without sinus: Secondary | ICD-10-CM

## 2020-07-17 MED ORDER — LIDOCAINE VISCOUS HCL 2 % MT SOLN: 15.0000 mL | OROMUCOSAL | 0 refills | Status: DC | PRN

## 2020-07-17 MED ORDER — SULFAMETHOXAZOLE-TRIMETHOPRIM 800-160 MG PO TABS: 1.0000 | ORAL_TABLET | Freq: Two times a day (BID) | ORAL | 0 refills | Status: AC

## 2020-07-17 NOTE — Discharge Instructions (Addendum)
Take ibuprofen or tylenol as needed for pain. Complete Bactrim as prescribed. Schedule a dental appointment to have teeth and gums evaluated.

## 2020-07-17 NOTE — ED Triage Notes (Signed)
Pain on left side of mouth x 5 to 6 months.  States pain went away and now has returned.  States the pain is making his whole head hurt.

## 2021-06-05 ENCOUNTER — Ambulatory Visit (INDEPENDENT_AMBULATORY_CARE_PROVIDER_SITE_OTHER): Payer: 59 | Admitting: Family Medicine

## 2021-06-05 ENCOUNTER — Encounter: Payer: Self-pay | Admitting: Family Medicine

## 2021-06-05 VITALS — BP 116/72 | HR 84 | Ht 72.0 in | Wt 182.4 lb

## 2021-06-05 DIAGNOSIS — E559 Vitamin D deficiency, unspecified: Secondary | ICD-10-CM

## 2021-06-05 DIAGNOSIS — K219 Gastro-esophageal reflux disease without esophagitis: Secondary | ICD-10-CM

## 2021-06-05 DIAGNOSIS — R7301 Impaired fasting glucose: Secondary | ICD-10-CM | POA: Diagnosis not present

## 2021-06-05 DIAGNOSIS — Z114 Encounter for screening for human immunodeficiency virus [HIV]: Secondary | ICD-10-CM | POA: Diagnosis not present

## 2021-06-05 DIAGNOSIS — Z1159 Encounter for screening for other viral diseases: Secondary | ICD-10-CM

## 2021-06-05 MED ORDER — OMEPRAZOLE 20 MG PO CPDR: 20.0000 mg | DELAYED_RELEASE_CAPSULE | Freq: Every day | ORAL | 3 refills | Status: AC

## 2021-06-05 NOTE — Progress Notes (Addendum)
? ?New Patient Office Visit ? ?Subjective:  ?Patient ID: Francisco Rosales, male    DOB: [birth year]  Age: 31 y.o. MRN: [id number] ? ?CC:  ?Chief Complaint  ?Patient presents with  ? New Patient (Initial Visit)  ?  Establishing care today, has concerns about his gallbladder.   ? ? ?HPI ?Francisco Rosales is a 31 y.o. male with past medical history of bilious vomiting with nausea presents for establishing care. He complains of RUQ pain after eating a cold BBQ ribs sandwich from the gas station on 05/27/21. He reports vomiting and abdominal pain shortly after food consumption. He says abdominal pain radiates to the back. The pain is described as burning that often occurs after eating and is relieved with rest and the elimination of spicy foods. He reports having heartburn and burning abdominal pain after drinking three beers on 06/03/21. He noted that the heartburn extended to his neck, making him want to vomit. He denies fever and chills. ? ?Past Medical History:  ?Diagnosis Date  ? Substance abuse ([place])   ? alcoholic  ? ? ?Past Surgical History:  ?Procedure Laterality Date  ? addenoidectomy    ? I & D EXTREMITY Right 05/01/2019  ? Procedure: RIGHT FOREARM IRRIGATION AND DEBRIDEMENT REMOVAL OF MULTIPLE FOREIGN BODIES AND FASCIOTOMIES;  Surgeon: Roseanne Kaufman, MD;  Location: [facility];  Service: Orthopedics;  Laterality: Right;  60 mins  ? TONSILLECTOMY    ? ? ?Family History  ?Problem Relation Age of Onset  ? Diabetes Maternal Aunt   ? ? ?Social History  ? ?Socioeconomic History  ? Marital status: Single  ?  Spouse name: Not on file  ? Number of children: Not on file  ? Years of education: 51  ? Highest education level: Not on file  ?Occupational History  ? Occupation: [employer]  ?Tobacco Use  ? Smoking status: Former  ?  Packs/day: 0.50  ?  Types: Cigarettes  ? Smokeless tobacco: Former  ?  Types: Chew  ? Tobacco comments:  ?  trying to quit  ?Substance and Sexual Activity  ? Alcohol use: No  ?  Comment: recovered alcoholic  ?  Drug use: No  ? Sexual activity: Yes  ?  Birth control/protection: None  ?Other Topics Concern  ? Not on file  ?Social History Narrative  ? Lives with mother and brother.  ? Used to work at [employer].   ? Works at [employer].   ? Does not eat fruits or veggies.  ? Does not smoke.  ? Dips tobacco.  ? Enjoys hunting and fishing.   ? ?Social Determinants of Health  ? ?Financial Resource Strain: Not on file  ?Food Insecurity: Not on file  ?Transportation Needs: Not on file  ?Physical Activity: Not on file  ?Stress: Not on file  ?Social Connections: Not on file  ?Intimate Partner Violence: Not on file  ? ? ?ROS ?Review of Systems  ?Constitutional:  Negative for chills, fatigue and fever.  ?HENT:  Negative for rhinorrhea, sinus pressure, sinus pain and sore throat.   ?Eyes:  Negative for pain, redness and itching.  ?Respiratory:  Negative for choking, chest tightness, shortness of breath and wheezing.   ?Gastrointestinal:  Negative for abdominal pain, blood in stool, constipation, diarrhea, nausea and vomiting.  ?Endocrine: Negative for polydipsia, polyphagia and polyuria.  ?Genitourinary:  Negative for frequency and urgency.  ?Musculoskeletal:  Negative for back pain, neck pain and neck stiffness.  ?Skin:  Negative for rash and wound.  ?Neurological:  Negative for tremors and weakness.  ?Psychiatric/Behavioral:  Negative for confusion, self-injury and suicidal ideas.   ? ?Objective:  ? ?Today's Vitals: BP 116/72   Pulse 84   Ht 6' (1.829 m)   Wt 182 lb 6.4 oz (82.7 kg)   SpO2 97%   BMI 24.74 kg/m?  ? ?Physical Exam ?Constitutional:   ?   Appearance: Normal appearance.  ?HENT:  ?   Head: Normocephalic.  ?   Right Ear: External ear normal.  ?   Left Ear: External ear normal.  ?   Nose: No congestion or rhinorrhea.  ?   Mouth/Throat:  ?   Mouth: Mucous membranes are moist.  ?Eyes:  ?   Extraocular Movements: Extraocular movements intact.  ?Neck:  ?   Vascular: No carotid bruit.  ?Cardiovascular:  ?   Rate and Rhythm:  Normal rate and regular rhythm.  ?   Pulses: Normal pulses.  ?   Heart sounds: Normal heart sounds.  ?Pulmonary:  ?   Effort: Pulmonary effort is normal.  ?   Breath sounds: Normal breath sounds. No wheezing.  ?Abdominal:  ?   General: Bowel sounds are normal.  ?   Palpations: Abdomen is soft.  ?   Tenderness: There is generalized abdominal tenderness and tenderness in the right upper quadrant, right lower quadrant, epigastric area and periumbilical area. There is no rebound. Negative signs include Murphy's sign and Rovsing's sign.  ?Musculoskeletal:  ?   Right lower leg: No edema.  ?   Left lower leg: No edema.  ?Lymphadenopathy:  ?   Cervical: No cervical adenopathy.  ?Skin: ?   General: Skin is warm.  ?   Findings: No bruising or erythema.  ?Neurological:  ?   Mental Status: He is alert and oriented to person, place, and time.  ?   Motor: No weakness.  ?   Coordination: Coordination normal.  ?Psychiatric:  ?   Comments: Normal affect  ? ? ?Assessment & Plan:  ? ?Problem List Items Addressed This Visit   ? ?  ? Digestive  ? GERD (gastroesophageal reflux disease) - Primary  ?  -Negative murphy sign ?-Symptoms likely due to GERD ?-PPI ordered ?Recommended Lifestyle Changes ? ?Lifestyle changes for GERD: ? ?Avoiding foods that trigger symptoms - Some foods also cause relaxation of the lower esophageal sphincter, which can lead to acid reflux. Excessive caffeine, chocolate, alcohol, peppermint, and fatty foods may cause bothersome acid reflux in some people. If you notice that your symptoms are worse after you have certain foods or beverages (trigger foods), it's reasonable to limit or avoid these things. ? ?Quitting smoking - Saliva helps to neutralize refluxed acid, and smoking reduces the amount of saliva in the mouth and throat. Smoking also lowers the pressure in the lower esophageal sphincter and provokes coughing, causing frequent episodes of acid reflux in the esophagus. In addition to having many other health  benefits, quitting smoking can reduce or eliminate symptoms of mild reflux.  ? ?  ?  ? Relevant Medications  ? omeprazole (PRILOSEC) 20 MG capsule  ? Other Relevant Orders  ? CBC with Differential/Platelet  ? CMP14+EGFR  ? Lipid panel  ? TSH + free T4  ? ?Other Visit Diagnoses   ? ? IFG (impaired fasting glucose)      ? Relevant Orders  ? Hemoglobin A1c  ? Vitamin D deficiency      ? Relevant Orders  ? Vitamin D (25 hydroxy)  ? Encounter for screening for  HIV      ? Relevant Orders  ? HIV antibody (with reflex)  ? Need for hepatitis C screening test      ? Relevant Orders  ? Hepatitis C antibody  ? ?  ? ? ?Outpatient Encounter Medications as of 06/05/2021  ?Medication Sig  ? omeprazole (PRILOSEC) 20 MG capsule Take 1 capsule (20 mg total) by mouth daily.  ? acetaminophen (TYLENOL) 500 MG tablet Take 1 tablet (500 mg total) by mouth every 6 (six) hours as needed. (Patient not taking: Reported on 06/05/2021)  ? albuterol (VENTOLIN HFA) 108 (90 Base) MCG/ACT inhaler Inhale 1-2 puffs into the lungs every 6 (six) hours as needed for wheezing or shortness of breath. (Patient not taking: Reported on 06/05/2021)  ? benzonatate (TESSALON) 100 MG capsule Take 1 capsule (100 mg total) by mouth 3 (three) times daily as needed for cough. (Patient not taking: Reported on 06/05/2021)  ? cetirizine (ZYRTEC ALLERGY) 10 MG tablet Take 1 tablet (10 mg total) by mouth daily. (Patient not taking: Reported on 06/05/2021)  ? fluticasone (FLONASE) 50 MCG/ACT nasal spray Place 1 spray into both nostrils daily for 7 days.  ? ibuprofen (ADVIL) 600 MG tablet Take 1 tablet (600 mg total) by mouth every 6 (six) hours as needed. (Patient not taking: Reported on 06/05/2021)  ? lidocaine (XYLOCAINE) 2 % solution Use as directed 15 mLs in the mouth or throat every 3 (three) hours as needed for mouth pain. (Patient not taking: Reported on 06/05/2021)  ? predniSONE (DELTASONE) 10 MG tablet Take 2 tablets (20 mg total) by mouth daily. (Patient not taking: Reported  on 06/05/2021)  ? ?No facility-administered encounter medications on file as of 06/05/2021.  ? ? ?Follow-up: Return in about 3 months (around 09/05/2021).  ? ?Alvira Monday, FNP ?

## 2021-06-05 NOTE — Patient Instructions (Signed)
I appreciate the opportunity to provide care to you today! ?  ?Follow up: 3 months ? ? labs: please stop by the lab to have your blood drawn ? ? Please pick up your medication at the pharmacy ?           ?Lifestyle changes for GERD: ? ?Avoiding foods that trigger symptoms - Some foods also cause relaxation of the lower esophageal sphincter, which can lead to acid reflux. Excessive caffeine, chocolate, alcohol, peppermint, and fatty foods may cause bothersome acid reflux in some people. If you notice that your symptoms are worse after you have certain foods or beverages (trigger foods), it's reasonable to limit or avoid these things. ? ?Quitting smoking - Saliva helps to neutralize refluxed acid, and smoking reduces the amount of saliva in the mouth and throat. Smoking also lowers the pressure in the lower esophageal sphincter and provokes coughing, causing frequent episodes of acid reflux in the esophagus. In addition to having many other health benefits, quitting smoking can reduce or eliminate symptoms of mild reflux.  ?  ?It was a pleasure to see you and I look forward to continuing to work together on your health and well-being. ?Please do not hesitate to call the office if you need care or have questions about your care. ?  ?Have a wonderful day and week. ?With Gratitude, ?Alvira Monday MSN, FNP-BC  ?

## 2021-06-05 NOTE — Assessment & Plan Note (Signed)
-  Negative murphy sign ?-Symptoms likely due to GERD ?-PPI ordered ?Recommended Lifestyle Changes ? ?Lifestyle changes for GERD: ? ?Avoiding foods that trigger symptoms - Some foods also cause relaxation of the lower esophageal sphincter, which can lead to acid reflux. Excessive caffeine, chocolate, alcohol, peppermint, and fatty foods may cause bothersome acid reflux in some people. If you notice that your symptoms are worse after you have certain foods or beverages (trigger foods), it's reasonable to limit or avoid these things. ? ?Quitting smoking - Saliva helps to neutralize refluxed acid, and smoking reduces the amount of saliva in the mouth and throat. Smoking also lowers the pressure in the lower esophageal sphincter and provokes coughing, causing frequent episodes of acid reflux in the esophagus. In addition to having many other health benefits, quitting smoking can reduce or eliminate symptoms of mild reflux.  ? ?

## 2021-06-07 LAB — CBC WITH DIFFERENTIAL/PLATELET
Basophils Absolute: 0 10*3/uL (ref 0.0–0.2)
Basos: 0 %
EOS (ABSOLUTE): 0 10*3/uL (ref 0.0–0.4)
Eos: 1 %
Hematocrit: 49.6 % (ref 37.5–51.0)
Hemoglobin: 17.1 g/dL (ref 13.0–17.7)
Immature Grans (Abs): 0 10*3/uL (ref 0.0–0.1)
Immature Granulocytes: 0 %
Lymphocytes Absolute: 2.2 10*3/uL (ref 0.7–3.1)
Lymphs: 31 %
MCH: 29.7 pg (ref 26.6–33.0)
MCHC: 34.5 g/dL (ref 31.5–35.7)
MCV: 86 fL (ref 79–97)
Monocytes Absolute: 0.4 10*3/uL (ref 0.1–0.9)
Monocytes: 5 %
Neutrophils Absolute: 4.3 10*3/uL (ref 1.4–7.0)
Neutrophils: 63 %
Platelets: 318 10*3/uL (ref 150–450)
RBC: 5.76 x10E6/uL (ref 4.14–5.80)
RDW: 12.9 % (ref 11.6–15.4)
WBC: 7 10*3/uL (ref 3.4–10.8)

## 2021-06-07 LAB — CMP14+EGFR
ALT: 34 IU/L (ref 0–44)
AST: 17 IU/L (ref 0–40)
Albumin/Globulin Ratio: 2.1 (ref 1.2–2.2)
Albumin: 4.8 g/dL (ref 4.1–5.2)
Alkaline Phosphatase: 79 IU/L (ref 44–121)
BUN/Creatinine Ratio: 10 (ref 9–20)
BUN: 9 mg/dL (ref 6–20)
Bilirubin Total: 0.4 mg/dL (ref 0.0–1.2)
CO2: 22 mmol/L (ref 20–29)
Calcium: 9.4 mg/dL (ref 8.7–10.2)
Chloride: 106 mmol/L (ref 96–106)
Creatinine, Ser: 0.88 mg/dL (ref 0.76–1.27)
Globulin, Total: 2.3 g/dL (ref 1.5–4.5)
Glucose: 84 mg/dL (ref 70–99)
Potassium: 4.6 mmol/L (ref 3.5–5.2)
Sodium: 141 mmol/L (ref 134–144)
Total Protein: 7.1 g/dL (ref 6.0–8.5)
eGFR: 119 mL/min/{1.73_m2} (ref >59)

## 2021-06-07 LAB — TSH+FREE T4
Free T4: 1.22 ng/dL (ref 0.82–1.77)
TSH: 1.38 u[IU]/mL (ref 0.450–4.500)

## 2021-06-07 LAB — LIPID PANEL
Chol/HDL Ratio: 3.4 ratio (ref 0.0–5.0)
Cholesterol, Total: 161 mg/dL (ref 100–199)
HDL: 47 mg/dL (ref >39)
LDL Chol Calc (NIH): 104 mg/dL — ABNORMAL HIGH (ref 0–99)
Triglycerides: 46 mg/dL (ref 0–149)
VLDL Cholesterol Cal: 10 mg/dL (ref 5–40)

## 2021-06-07 LAB — HIV ANTIBODY (ROUTINE TESTING W REFLEX): HIV Screen 4th Generation wRfx: NONREACTIVE

## 2021-06-07 LAB — HEMOGLOBIN A1C
Est. average glucose Bld gHb Est-mCnc: 103 mg/dL
Hgb A1c MFr Bld: 5.2 % (ref 4.8–5.6)

## 2021-06-07 LAB — HEPATITIS C ANTIBODY: Hep C Virus Ab: NONREACTIVE

## 2021-06-07 LAB — VITAMIN D 25 HYDROXY (VIT D DEFICIENCY, FRACTURES): Vit D, 25-Hydroxy: 34.4 ng/mL (ref 30.0–100.0)

## 2021-09-05 ENCOUNTER — Ambulatory Visit: Payer: 59 | Admitting: Family Medicine

## 2021-09-17 ENCOUNTER — Ambulatory Visit (INDEPENDENT_AMBULATORY_CARE_PROVIDER_SITE_OTHER): Payer: 59

## 2021-09-17 ENCOUNTER — Ambulatory Visit
Admission: EM | Admit: 2021-09-17 | Discharge: 2021-09-17 | Disposition: A | Discharge status: Home / Self Care | Payer: 59 | Attending: Nurse Practitioner | Admitting: Nurse Practitioner

## 2021-09-17 ENCOUNTER — Ambulatory Visit: Payer: 59

## 2021-09-17 DIAGNOSIS — S92424A Nondisplaced fracture of distal phalanx of right great toe, initial encounter for closed fracture: Secondary | ICD-10-CM | POA: Diagnosis not present

## 2021-09-17 DIAGNOSIS — S92314A Nondisplaced fracture of first metatarsal bone, right foot, initial encounter for closed fracture: Secondary | ICD-10-CM

## 2021-09-17 MED ORDER — IBUPROFEN 800 MG PO TABS
800.0000 mg | ORAL_TABLET | Freq: Three times a day (TID) | ORAL | 0 refills | Status: DC | PRN
Start: 2021-09-17 — End: 2021-10-03

## 2021-09-17 NOTE — Discharge Instructions (Addendum)
Your x-rays show a fracture or break in the right great toe. RICE therapy, rest, ice, compression, and elevation.  Apply ice for 20 minutes, remove for 1 hour, then repeat as much as possible to help with pain and swelling. Take medication as prescribed. Wear postop shoe with weightbearing. Follow-up with orthopedics within the next 24 to 48 hours.  You can follow-up with [facility] at [phone] or [facility] at [phone].

## 2021-09-17 NOTE — ED Triage Notes (Signed)
Pt presents with dropping a piece of steel on his foot. Pt has pain, bruising, and swelling to his right foot.

## 2021-09-17 NOTE — ED Provider Notes (Signed)
[facility]    CSN: [id number] Arrival date & time: 09/17/21  1846      History   Chief Complaint Chief Complaint  Patient presents with   Foot Pain    HPI Francisco Rosales is a 31 y.o. male.   The history is provided by the patient.    Patient presents with an injury to the right foot that occurred today.  Patient states he was loading a truck and dropped a 50 pound piece of steel onto the right foot.  Patient states he was wearing steel toed boots at the time.  He complains of pain, bruising, and swelling to the right great toe.  He has difficulty bearing weight when applying pressure to the right great toe.  He states the pain is described as "throbbing".  He has not taken any medication for his symptoms, nor has he applied ice.  Patient states that he just took his foot out of the boot prior to coming to be seen today. 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    Patient Active Problem List   Diagnosis Date Noted   GERD (gastroesophageal reflux disease) 06/05/2021   Hematochezia 03/06/2016   Tobacco abuse 03/06/2016   Alcohol abuse 03/06/2016   Bilious vomiting with nausea 03/06/2016   Immunization refused 03/06/2016    Past Surgical History:  Procedure Laterality Date   addenoidectomy     I & D EXTREMITY Right 05/01/2019   Procedure: RIGHT FOREARM IRRIGATION AND DEBRIDEMENT REMOVAL OF MULTIPLE FOREIGN BODIES AND FASCIOTOMIES;  Surgeon: Dominica Severin, MD;  Location: [facility] OR;  Service: Orthopedics;  Laterality: Right;  60 mins   TONSILLECTOMY         Home Medications    Prior to Admission medications   Medication Sig Start Date End Date Taking? Authorizing Provider  ibuprofen (ADVIL) 800 MG tablet Take 1 tablet (800 mg total) by mouth every 8 (eight) hours as needed. 09/17/21  Yes Serra Younan-Warren, Sadie Haber, NP  acetaminophen (TYLENOL) 500 MG tablet Take 1 tablet (500 mg total) by mouth every 6 (six) hours as  needed. Patient not taking: Reported on 06/05/2021 04/27/19   Michela Pitcher A, PA-C  albuterol (VENTOLIN HFA) 108 (90 Base) MCG/ACT inhaler Inhale 1-2 puffs into the lungs every 6 (six) hours as needed for wheezing or shortness of breath. Patient not taking: Reported on 06/05/2021 02/18/20   Durward Parcel, FNP  benzonatate (TESSALON) 100 MG capsule Take 1 capsule (100 mg total) by mouth 3 (three) times daily as needed for cough. Patient not taking: Reported on 06/05/2021 02/18/20   Durward Parcel, FNP  cetirizine (ZYRTEC ALLERGY) 10 MG tablet Take 1 tablet (10 mg total) by mouth daily. Patient not taking: Reported on 06/05/2021 02/18/20   Durward Parcel, FNP  fluticasone (FLONASE) 50 MCG/ACT nasal spray Place 1 spray into both nostrils daily for 7 days. 01/18/20 01/25/20  Avegno, Zachery Dakins, FNP  lidocaine (XYLOCAINE) 2 % solution Use as directed 15 mLs in the mouth or throat every 3 (three) hours as needed for mouth pain. Patient not taking: Reported on 06/05/2021 07/17/20   Bing Neighbors, FNP  omeprazole (PRILOSEC) 20 MG capsule Take 1 capsule (20 mg total) by mouth daily. 06/05/21   Gilmore Laroche, FNP  predniSONE (DELTASONE) 10 MG tablet Take 2 tablets (20 mg total) by mouth daily. Patient not taking: Reported on 06/05/2021 02/18/20   Durward Parcel, FNP    Family  History Family History  Problem Relation Age of Onset   Diabetes Maternal Aunt     Social History Social History   Tobacco Use   Smoking status: Former    Packs/day: 0.50    Types: Cigarettes   Smokeless tobacco: Former    Types: Chew   Tobacco comments:    trying to quit  Substance Use Topics   Alcohol use: No    Comment: recovered alcoholic   Drug use: No     Allergies   Dilaudid [hydromorphone hcl] and Keflex [cephalexin]   Review of Systems Review of Systems Per HPI  Physical Exam Triage Vital Signs ED Triage Vitals  Enc Vitals Group     BP 09/17/21 1856 116/64     Pulse Rate 09/17/21 1856 90      Resp 09/17/21 1856 17     Temp 09/17/21 1856 98 F (36.7 C)     Temp src --      SpO2 09/17/21 1856 95 %     Weight --      Height --      Head Circumference --      Peak Flow --      Pain Score 09/17/21 1855 10     Pain Loc --      Pain Edu? --      Excl. in GC? --    No data found.  Updated Vital Signs BP 116/64   Pulse 90   Temp 98 F (36.7 C)   Resp 17   SpO2 95%   Visual Acuity Right Eye Distance:   Left Eye Distance:   Bilateral Distance:    Right Eye Near:   Left Eye Near:    Bilateral Near:     Physical Exam Vitals and nursing note reviewed.  Constitutional:      Appearance: Normal appearance.  HENT:     Head: Normocephalic.  Eyes:     Extraocular Movements: Extraocular movements intact.     Pupils: Pupils are equal, round, and reactive to light.  Cardiovascular:     Rate and Rhythm: Normal rate and regular rhythm.     Heart sounds: Normal heart sounds.  Pulmonary:     Breath sounds: Normal breath sounds.  Abdominal:     General: Bowel sounds are normal.     Palpations: Abdomen is soft.  Musculoskeletal:     Cervical back: Normal range of motion.     Right foot: Decreased range of motion. Normal capillary refill. Swelling and tenderness present. No deformity. Normal pulse.     Comments: Ecchymosis noted to the right great toe.  Right great toe is tender to palpation.  Skin:    General: Skin is warm and dry.  Neurological:     General: No focal deficit present.     Mental Status: He is alert and oriented to person, place, and time.  Psychiatric:        Mood and Affect: Mood normal.        Behavior: Behavior normal.      UC Treatments / Results  Labs (all labs ordered are listed, but only abnormal results are displayed) Labs Reviewed - No data to display  EKG   Radiology DG Foot Complete Right  Result Date: 09/17/2021 CLINICAL DATA:  Swelling and bruising. EXAM: RIGHT FOOT COMPLETE - 3+ VIEW COMPARISON:  None Available. FINDINGS:  There is an acute nondisplaced fracture through the base of the first distal phalanx. There is no dislocation. Joint spaces are  well maintained. Soft tissues are within normal limits. IMPRESSION: Acute nondisplaced fracture base of the first distal phalanx. Electronically Signed   By: Darliss Cheney M.D.   On: 09/17/2021 19:49    Procedures Procedures (including critical care time)  Medications Ordered in UC Medications - No data to display  Initial Impression / Assessment and Plan / UC Course  I have reviewed the triage vital signs and the nursing notes.  Pertinent labs & imaging results that were available during my care of the patient were reviewed by me and considered in my medical decision making (see chart for details).  Patient presents for complaints of pain and swelling of the right foot after he dropped a piece of metal earlier today.  On exam, patient has ecchymosis to the right great toe.  Right great toe is tender to palpation.  X-rays show a nondisplaced fracture of the base of the first distal phalanx.  Patient was provided postop shoe and right great toe was buddy taped.  Supportive care recommendations were provided to the patient to include RICE therapy.  Patient was prescribed ibuprofen 800 mg for pain control.  Patient advised to follow-up with orthopedics within the next 24 to 48 hours.  Patient was given the information for [facility] and [facility].  Patient advised to follow-up as needed. Final Clinical Impressions(s) / UC Diagnoses   Final diagnoses:  Closed nondisplaced fracture of distal phalanx of right great toe, initial encounter     Discharge Instructions      Your x-rays show a fracture or break in the right great toe. RICE therapy, rest, ice, compression, and elevation.  Apply ice for 20 minutes, remove for 1 hour, then repeat as much as possible to help with pain and swelling. Take medication as prescribed. Wear postop shoe with  weightbearing. Follow-up with orthopedics within the next 24 to 48 hours.  You can follow-up with [facility] at [phone] or [facility] at [phone].     ED Prescriptions     Medication Sig Dispense Auth. Provider   ibuprofen (ADVIL) 800 MG tablet Take 1 tablet (800 mg total) by mouth every 8 (eight) hours as needed. 30 tablet Keisean Skowron-Warren, Sadie Haber, NP      PDMP not reviewed this encounter.   Abran Cantor, NP 09/17/21 2012

## 2021-09-19 ENCOUNTER — Ambulatory Visit (INDEPENDENT_AMBULATORY_CARE_PROVIDER_SITE_OTHER): Payer: 59 | Admitting: Orthopedic Surgery

## 2021-09-19 VITALS — Ht 72.0 in | Wt 180.0 lb

## 2021-09-19 DIAGNOSIS — S92424A Nondisplaced fracture of distal phalanx of right great toe, initial encounter for closed fracture: Secondary | ICD-10-CM | POA: Diagnosis not present

## 2021-09-20 ENCOUNTER — Encounter: Payer: Self-pay | Admitting: Orthopedic Surgery

## 2021-09-20 NOTE — Progress Notes (Signed)
New Patient Visit  Assessment: Francisco Rosales is a 31 y.o. male with the following: 1. Nondisplaced fracture of distal phalanx of right great toe, initial encounter for closed fracture  Plan: Lamin Behney sustained a fracture of the right great toe.  He is concerned about returning to work.  I will see him back in 2 weeks.  Okay to bear weight through the heel, but no weightbearing through the forefoot.  Wear the postop shoe as needed.  Elevate foot to help with swelling and pain.  Medications as needed.  Follow-up: Return in about 2 weeks (around 10/03/2021).  Subjective:  Chief Complaint  Patient presents with   Foot Injury    Rt big toe and 2nd toe DOI 09/17/21.    History of Present Illness: Francisco Rosales is a 31 y.o. male who presents for evaluation of right great toe pain.  A couple of days ago, he dropped a heavy metal plate on his right foot.  He was wearing steel toe boots, but it hit just behind the toe plate.  He had immediate pain.  He presented the emergency department, where x-rays demonstrated a fracture.  He has noted bruising.  He is ambulating through his heel, using a crutch.  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  He is hoping to be able to return soon as possible.  No additional injuries.   Review of Systems: No fevers or chills No numbness or tingling No chest pain No shortness of breath No bowel or bladder dysfunction No GI distress No headaches   Medical History:  Past Medical History:  Diagnosis Date   Substance abuse (HCC)    alcoholic    Past Surgical History:  Procedure Laterality Date   addenoidectomy     I & D EXTREMITY Right 05/01/2019   Procedure: RIGHT FOREARM IRRIGATION AND DEBRIDEMENT REMOVAL OF MULTIPLE FOREIGN BODIES AND FASCIOTOMIES;  Surgeon: Dominica Severin, MD;  Location: [facility] OR;  Service: Orthopedics;  Laterality: Right;  60 mins   TONSILLECTOMY      Family History  Problem Relation Age of Onset    Diabetes Maternal Aunt    Social History   Tobacco Use   Smoking status: Former    Packs/day: 0.50    Types: Cigarettes   Smokeless tobacco: Former    Types: Chew   Tobacco comments:    trying to quit  Substance Use Topics   Alcohol use: No    Comment: recovered alcoholic   Drug use: No    Allergies  Allergen Reactions   Dilaudid [Hydromorphone Hcl]     Agitation during withdrawal   Keflex [Cephalexin] Itching    Reports as being severe     No outpatient medications have been marked as taking for the 09/19/21 encounter (Office Visit) with Oliver Barre, MD.    Objective: Ht 6' (1.829 m)   Wt 180 lb (81.6 kg)   BMI 24.41 kg/m   Physical Exam:  General: Alert and oriented. and No acute distress. Gait: Right sided antalgic gait.  Evaluation of the right foot demonstrates diffuse bruising, swelling about the great toe.  Nail is a normal color.  No lacerations or abrasions.  Good blood flow to the tip of the toe.  Sensation is intact proximal to the bruising.  IMAGING: I personally reviewed images previously obtained from the ED  X-rays of the great toe demonstrates minimally displaced fractures of the distal phalanx.   New  Medications:  No orders of the defined types were placed in this encounter.     Oliver Barre, MD  09/20/2021 10:10 PM

## 2021-10-03 ENCOUNTER — Ambulatory Visit (INDEPENDENT_AMBULATORY_CARE_PROVIDER_SITE_OTHER): Payer: 59 | Admitting: Orthopedic Surgery

## 2021-10-03 ENCOUNTER — Ambulatory Visit (INDEPENDENT_AMBULATORY_CARE_PROVIDER_SITE_OTHER): Payer: 59

## 2021-10-03 DIAGNOSIS — S92424D Nondisplaced fracture of distal phalanx of right great toe, subsequent encounter for fracture with routine healing: Secondary | ICD-10-CM

## 2021-10-03 DIAGNOSIS — S92424A Nondisplaced fracture of distal phalanx of right great toe, initial encounter for closed fracture: Secondary | ICD-10-CM

## 2021-10-03 MED ORDER — IBUPROFEN 800 MG PO TABS
800.0000 mg | ORAL_TABLET | Freq: Three times a day (TID) | ORAL | 0 refills | Status: AC | PRN
Start: 2021-10-03 — End: ?

## 2021-10-04 ENCOUNTER — Encounter: Payer: Self-pay | Admitting: Orthopedic Surgery

## 2021-10-04 NOTE — Progress Notes (Signed)
Return patient Visit  Assessment: Francisco Rosales is a 31 y.o. male with the following: 1. Nondisplaced fracture of distal phalanx of right great toe, subsequent encounter for closed fracture  Plan: Francisco Rosales sustained a fracture of the right great toe.  He has been able to complete light duty at work.  He would like to transition to full duty, with limited hours.  I think this is reasonable.  Radiographs are stable.  He can bear weight as tolerated through his heel, limited forefoot weightbearing.  Follow-up in 2 weeks for repeat evaluation.   Follow-up: Return in about 2 weeks (around 10/17/2021).  Subjective:  Chief Complaint  Patient presents with   Fracture    Rt foot great and 2nd toe DOI 09/17/21    History of Present Illness: Francisco Rosales is a 31 y.o. male who presents for evaluation of right great toe pain.  I last saw him in clinic approximately 2 weeks ago.  He is doing better.  He is no longer wearing a postop shoe.  He has been buddy taping his toes.  He states he is able to do many activities.  He is not interested in returning to light duty.  He would like to return to full duty at work, or be written out completely.   Review of Systems: No fevers or chills No numbness or tingling No chest pain No shortness of breath No bowel or bladder dysfunction No GI distress No headaches    Objective: There were no vitals taken for this visit.  Physical Exam:  General: Alert and oriented. and No acute distress. Gait: Right sided antalgic gait.  Right foot with some residual swelling.  No open lacerations.  Tolerates active motion of the great toe, as well as the needed.  No acute dislocations.  Toes are warm and well-perfused.  2+ DP pulse.  IMAGING: I personally reviewed images previously obtained from the ED  X-rays of the right foot were obtained in clinic today.  No acute injuries are noted.  Stable overall alignment.  Minimally displaced fractures of the distal  phalanx of the great toe, as well as the second are stable.  No interval displacement.  No dislocations.  Impression: Stable great and second toe distal phalanx fractures   New Medications:  Meds ordered this encounter  Medications   ibuprofen (ADVIL) 800 MG tablet    Sig: Take 1 tablet (800 mg total) by mouth every 8 (eight) hours as needed.    Dispense:  90 tablet    Refill:  0      Oliver Barre, MD  10/04/2021 2:06 PM

## 2021-10-22 ENCOUNTER — Ambulatory Visit
Admission: EM | Admit: 2021-10-22 | Discharge: 2021-10-22 | Disposition: A | Discharge status: Home / Self Care | Payer: 59 | Attending: Nurse Practitioner | Admitting: Nurse Practitioner

## 2021-10-22 DIAGNOSIS — K0889 Other specified disorders of teeth and supporting structures: Secondary | ICD-10-CM | POA: Diagnosis not present

## 2021-10-22 DIAGNOSIS — H6123 Impacted cerumen, bilateral: Secondary | ICD-10-CM

## 2021-10-22 MED ORDER — ACETAMINOPHEN 500 MG PO TABS: 500.0000 mg | ORAL_TABLET | Freq: Four times a day (QID) | ORAL | 0 refills | Status: AC | PRN

## 2021-10-22 MED ORDER — CLINDAMYCIN HCL 150 MG PO CAPS: 450.0000 mg | ORAL_CAPSULE | Freq: Three times a day (TID) | ORAL | 0 refills | Status: AC

## 2021-10-22 NOTE — ED Triage Notes (Addendum)
Pt has broken tooth on bottom left side of mouth. Reports pain causing a headache x 3 days. Pt has taken 800 mg ibuprofen. Reports taking 2 of them at a time with no relief.

## 2021-10-22 NOTE — Discharge Instructions (Addendum)
Please start the Clindamycin and take it as prescribed.  Follow up with a Dentist in the next week or 2 - there is a print out with information on dentists you can contact.  Continue ibuprofen 800 mg every 8 hours as needed for pain along with Tylenol 500 to 1000 mg every 6 hours as needed for pain We flushed your ears out today and were able to remove the earwax.  Please stop using Q-tips.

## 2021-10-22 NOTE — ED Provider Notes (Signed)
[facility]    CSN: [id number] Arrival date & time: 10/22/21  1041      History   Chief Complaint Chief Complaint  Patient presents with   Dental Pain    HPI Francisco Rosales is a 31 y.o. male.   Patient presents for left lower dental pain that began 3 days ago.  He also endorses bilateral ear pain and headache.  Reports he has multiple broken teeth that he is aware likely need to be removed.  Does not currently have a dentist.  He denies fever, nausea/vomiting.  No drainage inside his mouth that he knows of.  Reports the pain is severe and he has taken ibuprofen without much relief.  Reports ear pain is worsened because of the tooth pain.  Reports he does struggle with a lot of earwax and has tried to use Q-tips to remove the earwax but he feels that he is pushing them further.  Denies sore throat, cough, congestion, shortness of breath, chest pain.      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    Patient Active Problem List   Diagnosis Date Noted   GERD (gastroesophageal reflux disease) 06/05/2021   Hematochezia 03/06/2016   Tobacco abuse 03/06/2016   Alcohol abuse 03/06/2016   Bilious vomiting with nausea 03/06/2016   Immunization refused 03/06/2016    Past Surgical History:  Procedure Laterality Date   addenoidectomy     I & D EXTREMITY Right 05/01/2019   Procedure: RIGHT FOREARM IRRIGATION AND DEBRIDEMENT REMOVAL OF MULTIPLE FOREIGN BODIES AND FASCIOTOMIES;  Surgeon: Dominica Severin, MD;  Location: [facility] OR;  Service: Orthopedics;  Laterality: Right;  60 mins   TONSILLECTOMY         Home Medications    Prior to Admission medications   Medication Sig Start Date End Date Taking? Authorizing Provider  clindamycin (CLEOCIN) 150 MG capsule Take 3 capsules (450 mg total) by mouth every 8 (eight) hours for 7 days. 10/22/21 10/29/21 Yes Valentino Nose, NP  acetaminophen (TYLENOL) 500 MG tablet Take 1-2 tablets (500-1,000  mg total) by mouth every 6 (six) hours as needed for moderate pain. 10/22/21   Valentino Nose, NP  albuterol (VENTOLIN HFA) 108 (90 Base) MCG/ACT inhaler Inhale 1-2 puffs into the lungs every 6 (six) hours as needed for wheezing or shortness of breath. Patient not taking: Reported on 06/05/2021 02/18/20   Durward Parcel, FNP  benzonatate (TESSALON) 100 MG capsule Take 1 capsule (100 mg total) by mouth 3 (three) times daily as needed for cough. Patient not taking: Reported on 06/05/2021 02/18/20   Durward Parcel, FNP  cetirizine (ZYRTEC ALLERGY) 10 MG tablet Take 1 tablet (10 mg total) by mouth daily. Patient not taking: Reported on 06/05/2021 02/18/20   Durward Parcel, FNP  fluticasone (FLONASE) 50 MCG/ACT nasal spray Place 1 spray into both nostrils daily for 7 days. 01/18/20 01/25/20  Avegno, Zachery Dakins, FNP  ibuprofen (ADVIL) 800 MG tablet Take 1 tablet (800 mg total) by mouth every 8 (eight) hours as needed. 10/03/21   Oliver Barre, MD  lidocaine (XYLOCAINE) 2 % solution Use as directed 15 mLs in the mouth or throat every 3 (three) hours as needed for mouth pain. Patient not taking: Reported on 06/05/2021 07/17/20   Bing Neighbors, FNP  omeprazole (PRILOSEC) 20 MG capsule Take 1 capsule (20 mg total) by mouth daily. 06/05/21   Gilmore Laroche, FNP  predniSONE (DELTASONE)  10 MG tablet Take 2 tablets (20 mg total) by mouth daily. Patient not taking: Reported on 06/05/2021 02/18/20   Durward Parcel, FNP    Family History Family History  Problem Relation Age of Onset   Diabetes Maternal Aunt     Social History Social History   Tobacco Use   Smoking status: Former    Packs/day: 0.50    Types: Cigarettes   Smokeless tobacco: Former    Types: Chew   Tobacco comments:    trying to quit  Substance Use Topics   Alcohol use: No    Comment: recovered alcoholic   Drug use: No     Allergies   Dilaudid [hydromorphone hcl] and Keflex [cephalexin]   Review of Systems Review of  Systems Per HPI  Physical Exam Triage Vital Signs ED Triage Vitals  Enc Vitals Group     BP 10/22/21 1114 127/73     Pulse Rate 10/22/21 1114 (!) 56     Resp 10/22/21 1114 18     Temp 10/22/21 1114 98.3 F (36.8 C)     Temp src --      SpO2 10/22/21 1114 98 %     Weight --      Height --      Head Circumference --      Peak Flow --      Pain Score 10/22/21 1112 9     Pain Loc --      Pain Edu? --      Excl. in GC? --    No data found.  Updated Vital Signs BP 127/73   Pulse (!) 56   Temp 98.3 F (36.8 C)   Resp 18   SpO2 98%   Visual Acuity Right Eye Distance:   Left Eye Distance:   Bilateral Distance:    Right Eye Near:   Left Eye Near:    Bilateral Near:     Physical Exam Vitals and nursing note reviewed.  Constitutional:      General: He is not in acute distress.    Appearance: Normal appearance. He is not toxic-appearing.  HENT:     Head: Normocephalic and atraumatic.     Right Ear: There is impacted cerumen.     Left Ear: There is impacted cerumen.     Nose: Nose normal. No congestion or rhinorrhea.     Mouth/Throat:     Mouth: Mucous membranes are moist.     Dentition: Abnormal dentition. Dental tenderness, gingival swelling and dental caries present.     Tongue: No lesions.     Pharynx: Oropharynx is clear. No posterior oropharyngeal erythema.  Eyes:     General: No scleral icterus.    Extraocular Movements: Extraocular movements intact.  Pulmonary:     Effort: Pulmonary effort is normal. No respiratory distress.  Musculoskeletal:     Cervical back: Normal range of motion.  Lymphadenopathy:     Cervical: No cervical adenopathy.  Skin:    General: Skin is warm and dry.     Capillary Refill: Capillary refill takes less than 2 seconds.     Coloration: Skin is not jaundiced or pale.     Findings: No erythema.  Neurological:     Mental Status: He is alert and oriented to person, place, and time.  Psychiatric:        Behavior: Behavior is  cooperative.      UC Treatments / Results  Labs (all labs ordered are listed, but only abnormal results  are displayed) Labs Reviewed - No data to display  EKG   Radiology No results found.  Procedures Ear Cerumen Removal  Date/Time: 10/22/2021 1:39 PM  Performed by: Eulogio Bear, NP Authorized by: Eulogio Bear, NP   Consent:    Consent obtained:  Verbal   Consent given by:  Patient   Risks, benefits, and alternatives were discussed: yes     Risks discussed:  Pain, TM perforation, incomplete removal and dizziness   Alternatives discussed:  Alternative treatment Universal protocol:    Procedure explained and questions answered to patient or proxy's satisfaction: yes     Patient identity confirmed:  Verbally with patient Procedure details:    Location:  L ear and R ear   Procedure type: irrigation     Procedure outcomes: cerumen removed   Post-procedure details:    Inspection:  No bleeding, TM intact and ear canal clear   Hearing quality:  Improved   Procedure completion:  Tolerated well, no immediate complications  (including critical care time)  Medications Ordered in UC Medications - No data to display  Initial Impression / Assessment and Plan / UC Course  I have reviewed the triage vital signs and the nursing notes.  Pertinent labs & imaging results that were available during my care of the patient were reviewed by me and considered in my medical decision making (see chart for details).    Patient is well-appearing, normotensive, afebrile, not tachycardic, not tachypneic, oxygenating well on room air.  Treat dental pain, likely acute gingivitis with clindamycin.  Tylenol/ibuprofen recommended for pain control.  Recommended close follow-up with dentist and dental resource list given.  Ceruminosis treated with bilateral ear lavage.  Patient initially became a little bit dizzy and lightheaded, however after resting for a couple of minutes, this  improved.  After lavage, tympanic membranes intact bilaterally and cerumen completely removed.  Encouraged discontinuing use of Q-tips.  Note given for work.  The patient was given the opportunity to ask questions.  All questions answered to their satisfaction.  The patient is in agreement to this plan.   Final Clinical Impressions(s) / UC Diagnoses   Final diagnoses:  Pain, dental  Ceruminosis, bilateral     Discharge Instructions      Please start the Clindamycin and take it as prescribed.  Follow up with a Dentist in the next week or 2 - there is a print out with information on dentists you can contact.  Continue ibuprofen 800 mg every 8 hours as needed for pain along with Tylenol 500 to 1000 mg every 6 hours as needed for pain We flushed your ears out today and were able to remove the earwax.  Please stop using Q-tips.    ED Prescriptions     Medication Sig Dispense Auth. Provider   acetaminophen (TYLENOL) 500 MG tablet Take 1-2 tablets (500-1,000 mg total) by mouth every 6 (six) hours as needed for moderate pain. 60 tablet Noemi Chapel A, NP   clindamycin (CLEOCIN) 150 MG capsule Take 3 capsules (450 mg total) by mouth every 8 (eight) hours for 7 days. 63 capsule Eulogio Bear, NP      PDMP not reviewed this encounter.   Eulogio Bear, NP 10/22/21 1342

## 2021-10-24 ENCOUNTER — Ambulatory Visit: Payer: 59 | Admitting: Orthopedic Surgery

## 2022-03-03 IMAGING — DX DG FOREARM 2V*R*
2 series · 2 of 2 positions shown · non-contrast
Comparison: None.

CLINICAL DATA: Right forearm laceration

EXAM:
RIGHT FOREARM - 2 VIEW

[forearm ap]
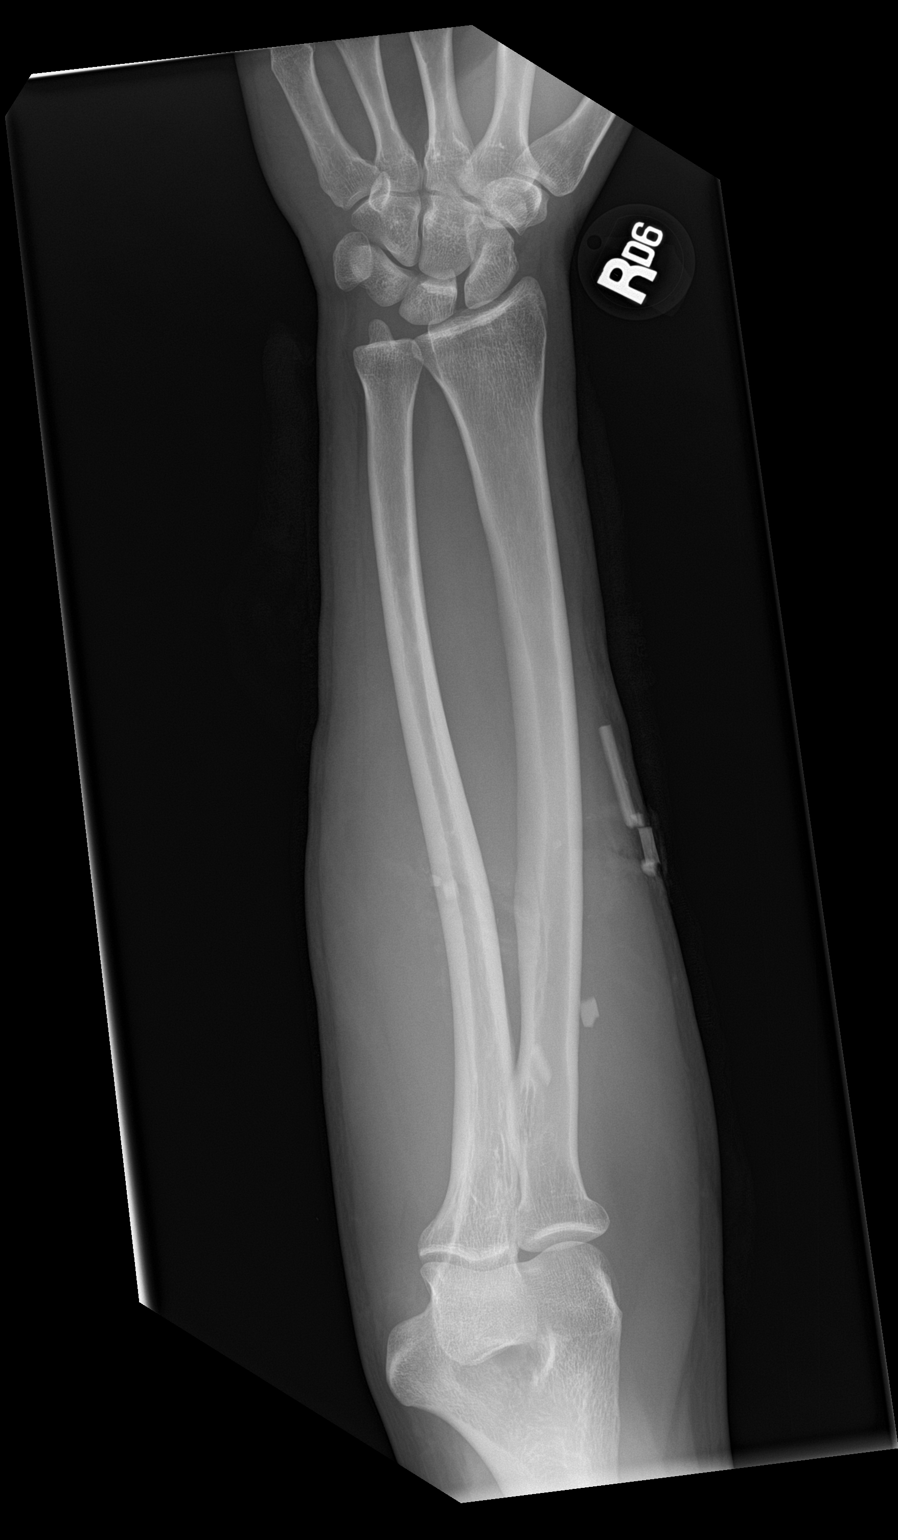

[forearm lat]
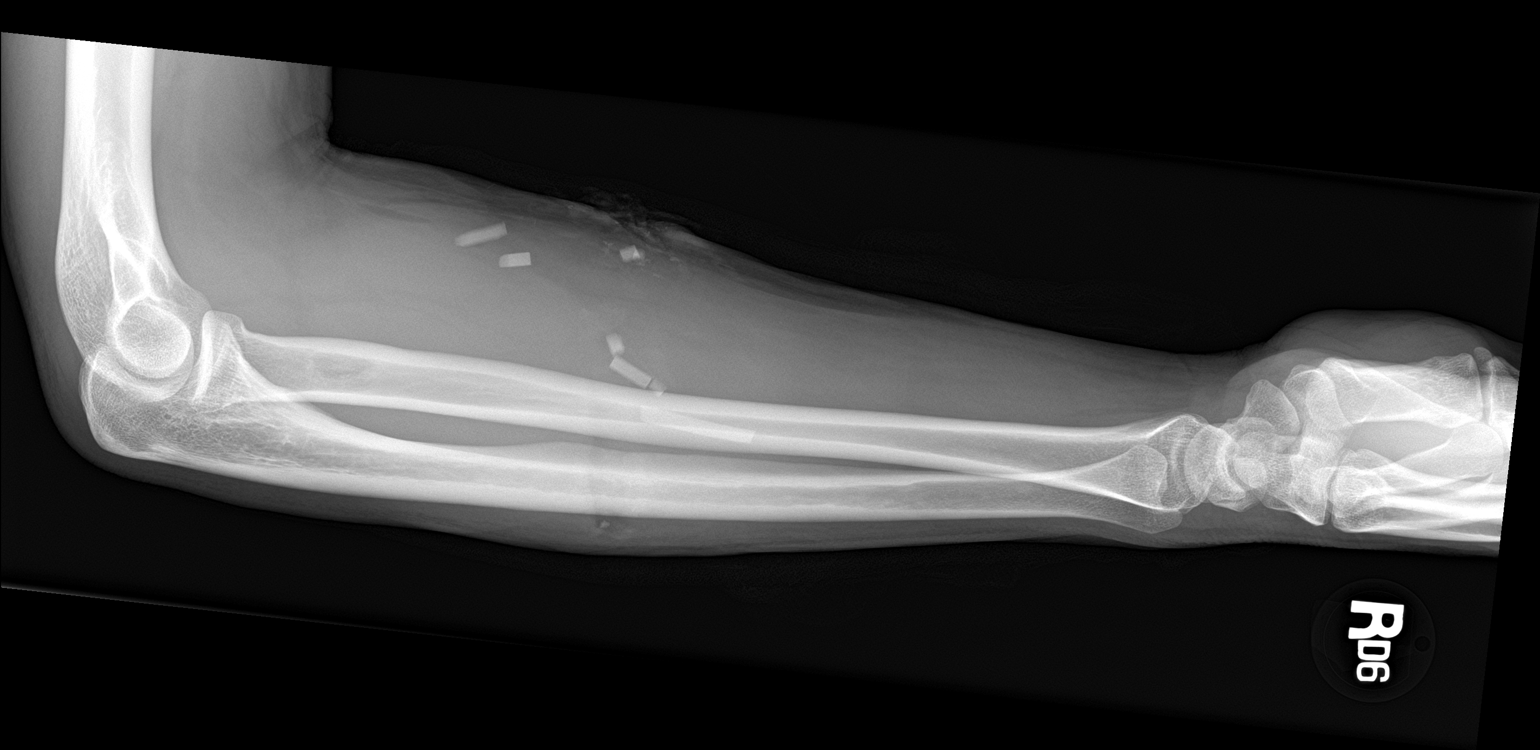

[2 of 2 positions shown; findings below may reference images not displayed]

FINDINGS: There is no evidence of fracture or other focal bone lesions. Soft
tissue defect over the volar aspect of the forearm compatible with
history of laceration. There are multiple rectangular and smaller
irregularly shaped radiopaque foreign bodies within the soft tissues
of the volar and radial aspects of the forearm. The largest is a
rectangular density measuring 2.8 x 0.3 cm. There are at least 7
discrete foreign bodies measuring at least 0.3 cm in size.
Additional tiny millimetric punctate densities at the level of the
skin laceration.
IMPRESSION: 1. No acute osseous abnormality.
2. Multiple radiopaque foreign bodies within the soft tissues of the
forearm as described above. The largest is a rectangular density
measuring 2.8 x 0.3 cm in size.

## 2022-08-10 ENCOUNTER — Ambulatory Visit
Admission: EM | Admit: 2022-08-10 | Discharge: 2022-08-10 | Disposition: A | Discharge status: Home / Self Care | Payer: Managed Care, Other (non HMO) | Attending: Nurse Practitioner | Admitting: Nurse Practitioner

## 2022-08-10 ENCOUNTER — Emergency Department (HOSPITAL_COMMUNITY)
Admission: EM | Admit: 2022-08-10 | Discharge: 2022-08-10 | Discharge status: Against Medical Advice | Payer: Managed Care, Other (non HMO) | Source: Home / Self Care

## 2022-08-10 ENCOUNTER — Encounter: Payer: Self-pay | Admitting: Emergency Medicine

## 2022-08-10 DIAGNOSIS — R1033 Periumbilical pain: Secondary | ICD-10-CM

## 2022-08-10 LAB — POCT URINALYSIS DIP (MANUAL ENTRY)
Bilirubin, UA: NEGATIVE
Blood, UA: NEGATIVE
Glucose, UA: NEGATIVE mg/dL
Ketones, POC UA: NEGATIVE mg/dL
Leukocytes, UA: NEGATIVE
Nitrite, UA: NEGATIVE
Protein Ur, POC: NEGATIVE mg/dL
Spec Grav, UA: 1.025 (ref 1.010–1.025)
Urobilinogen, UA: 0.2 E.U./dL
pH, UA: 7 (ref 5.0–8.0)

## 2022-08-10 NOTE — ED Notes (Signed)
Patient is being discharged from the Urgent Care and sent to the Emergency Department via POV . Per Chrisite Leath-Warren,NP; Patient is in need of higher level of care due to Abdominal pain and Pain with urination. Patient is aware and verbalizes understanding of plan of care.  Vitals:   08/10/22 1051  BP: 122/73  Pulse: (!) 55  Resp: 18  Temp: 98.6 F (37 C)  SpO2: 96%

## 2022-08-10 NOTE — ED Triage Notes (Signed)
ABD pain from naval down x 5 days.  Pain on urination.  Feel nauseated.

## 2022-08-10 NOTE — ED Provider Notes (Signed)
[facility]    CSN: [id number] Arrival date & time: 08/10/22  1039      History   Chief Complaint No chief complaint on file.   HPI Francisco Rosales is a 32 y.o. male.   The history is provided by the patient.   The patient presents for complaints pain around his bellybutton has been present for the past 5 days.  Patient states since the pain started, it usually would come and go, but today, the pain started around 4 AM and has gotten worse.  Patient describes the pain as "burning".  Patient also reports 2 episodes of nausea this morning.  Patient also states that he experienced an episode of pain with urination this morning, but has not had any more pain since that time.  Patient denies fever, chills, chest pain, diarrhea, constipation, urinary frequency, urgency, hesitancy, or penile discharge.  Per review of the patient's chart, patient has a history of reflux disease.  He states he cannot tell if this is similar to his reflux symptoms.  Patient also reports history of kidney stones. 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    Patient Active Problem List   Diagnosis Date Noted   GERD (gastroesophageal reflux disease) 06/05/2021   Hematochezia 03/06/2016   Tobacco abuse 03/06/2016   Alcohol abuse 03/06/2016   Bilious vomiting with nausea 03/06/2016   Immunization refused 03/06/2016    Past Surgical History:  Procedure Laterality Date   addenoidectomy     I & D EXTREMITY Right 05/01/2019   Procedure: RIGHT FOREARM IRRIGATION AND DEBRIDEMENT REMOVAL OF MULTIPLE FOREIGN BODIES AND FASCIOTOMIES;  Surgeon: Dominica Severin, MD;  Location: [facility] OR;  Service: Orthopedics;  Laterality: Right;  60 mins   TONSILLECTOMY         Home Medications    Prior to Admission medications   Medication Sig Start Date End Date Taking? Authorizing Provider  acetaminophen (TYLENOL) 500 MG tablet Take 1-2 tablets (500-1,000 mg total) by mouth every 6 (six)  hours as needed for moderate pain. 10/22/21   Valentino Nose, NP  albuterol (VENTOLIN HFA) 108 (90 Base) MCG/ACT inhaler Inhale 1-2 puffs into the lungs every 6 (six) hours as needed for wheezing or shortness of breath. Patient not taking: Reported on 06/05/2021 02/18/20   Durward Parcel, FNP  cetirizine (ZYRTEC ALLERGY) 10 MG tablet Take 1 tablet (10 mg total) by mouth daily. Patient not taking: Reported on 06/05/2021 02/18/20   Durward Parcel, FNP  fluticasone (FLONASE) 50 MCG/ACT nasal spray Place 1 spray into both nostrils daily for 7 days. 01/18/20 01/25/20  Avegno, Zachery Dakins, FNP  ibuprofen (ADVIL) 800 MG tablet Take 1 tablet (800 mg total) by mouth every 8 (eight) hours as needed. 10/03/21   Oliver Barre, MD  omeprazole (PRILOSEC) 20 MG capsule Take 1 capsule (20 mg total) by mouth daily. 06/05/21   Gilmore Laroche, FNP    Family History Family History  Problem Relation Age of Onset   Diabetes Maternal Aunt     Social History Social History   Tobacco Use   Smoking status: Former    Packs/day: .5    Types: Cigarettes   Smokeless tobacco: Former    Types: Chew   Tobacco comments:    trying to quit  Substance Use Topics   Alcohol use: No    Comment: recovered alcoholic   Drug use: No     Allergies   Dilaudid [hydromorphone  hcl] and Keflex [cephalexin]   Review of Systems Review of Systems Per HPI  Physical Exam Triage Vital Signs ED Triage Vitals  Enc Vitals Group     BP 08/10/22 1051 122/73     Pulse Rate 08/10/22 1051 (!) 55     Resp 08/10/22 1051 18     Temp 08/10/22 1051 98.6 F (37 C)     Temp Source 08/10/22 1051 Oral     SpO2 08/10/22 1051 96 %     Weight --      Height --      Head Circumference --      Peak Flow --      Pain Score 08/10/22 1052 10     Pain Loc --      Pain Edu? --      Excl. in GC? --    No data found.  Updated Vital Signs BP 122/73 (BP Location: Right Arm)   Pulse (!) 55   Temp 98.6 F (37 C) (Oral)   Resp 18    SpO2 96%   Visual Acuity Right Eye Distance:   Left Eye Distance:   Bilateral Distance:    Right Eye Near:   Left Eye Near:    Bilateral Near:     Physical Exam Vitals and nursing note reviewed.  Constitutional:      Appearance: Normal appearance. He is ill-appearing.  HENT:     Head: Normocephalic.  Eyes:     Extraocular Movements: Extraocular movements intact.     Pupils: Pupils are equal, round, and reactive to light.  Cardiovascular:     Rate and Rhythm: Regular rhythm.     Pulses: Normal pulses.     Heart sounds: Normal heart sounds.  Pulmonary:     Effort: Pulmonary effort is normal. No respiratory distress.     Breath sounds: Normal breath sounds. No stridor. No wheezing, rhonchi or rales.  Abdominal:     General: Abdomen is flat.     Palpations: Abdomen is soft.     Tenderness: There is abdominal tenderness in the right lower quadrant, periumbilical area and left lower quadrant.  Musculoskeletal:     Cervical back: Normal range of motion.  Skin:    General: Skin is warm and dry.  Neurological:     General: No focal deficit present.     Mental Status: He is alert and oriented to person, place, and time.  Psychiatric:        Mood and Affect: Mood normal.        Behavior: Behavior normal.      UC Treatments / Results  Labs (all labs ordered are listed, but only abnormal results are displayed) Labs Reviewed  POCT URINALYSIS DIP (MANUAL ENTRY)    EKG   Radiology No results found.  Procedures Procedures (including critical care time)  Medications Ordered in UC Medications - No data to display  Initial Impression / Assessment and Plan / UC Course  I have reviewed the triage vital signs and the nursing notes.  Pertinent labs & imaging results that were available during my care of the patient were reviewed by me and considered in my medical decision making (see chart for details).  The patient's vital signs are stable at this time.  Patient rates  his abdominal pain 10/10 at present.  On exam, patient has moderate abdominal tenderness in the left lower quadrant, right lower quadrant, and around the umbilicus.  Urinalysis was negative.  Patient also reports 2 episodes  of nausea and vomiting.  Given the patient's severity of symptoms, it is recommended that he go to the emergency department for further evaluation.  Patient contacted his mother to come and pick him up to take him to the emergency department.  Patient's mother presented, patient was ambulatory at discharge.  Final Clinical Impressions(s) / UC Diagnoses   Final diagnoses:  None     Discharge Instructions      Go to the emergency department for further evaluation of your abdominal pain.     ED Prescriptions   None    PDMP not reviewed this encounter.   Abran Cantor, NP 08/10/22 1225

## 2022-08-10 NOTE — Discharge Instructions (Signed)
Go to the emergency department for further evaluation of your abdominal pain

## 2022-08-11 DIAGNOSIS — Z87891 Personal history of nicotine dependence: Secondary | ICD-10-CM | POA: Diagnosis not present

## 2022-08-11 DIAGNOSIS — Z881 Allergy status to other antibiotic agents status: Secondary | ICD-10-CM | POA: Diagnosis not present

## 2022-08-11 DIAGNOSIS — R1031 Right lower quadrant pain: Secondary | ICD-10-CM | POA: Diagnosis not present

## 2022-08-11 DIAGNOSIS — Z885 Allergy status to narcotic agent status: Secondary | ICD-10-CM | POA: Diagnosis not present

## 2022-08-11 DIAGNOSIS — K3580 Unspecified acute appendicitis: Secondary | ICD-10-CM | POA: Diagnosis not present

## 2022-11-23 IMAGING — CT CT ANGIO HEAD
3 of 10 series · 17 of 47 positions shown · IV contrast (Omnipaque or Isovue)
Comparison: CT head 0633

CLINICAL DATA: Bilateral ear pain and headache

EXAM:
CT ANGIOGRAPHY HEAD
TECHNIQUE: Multidetector CT imaging of the head was performed using the
standard protocol during bolus administration of intravenous
contrast. Multiplanar CT image reconstructions and MIPs were
obtained to evaluate the vascular anatomy.
CONTRAST:  75mL OMNIPAQUE IOHEXOL 350 MG/ML SOLN

[Series 7: ax thin · axial · 0.40mm/px · z∈[+31,+173]mm · 11 of 167 slices shown]
[im 12/167  brain]
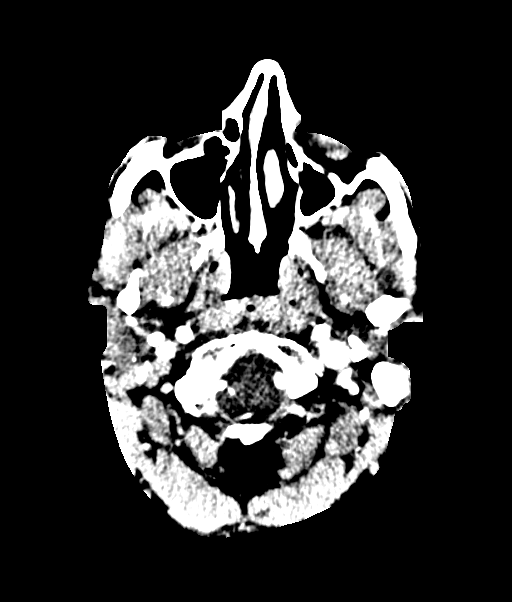
[im 23/167  bone]
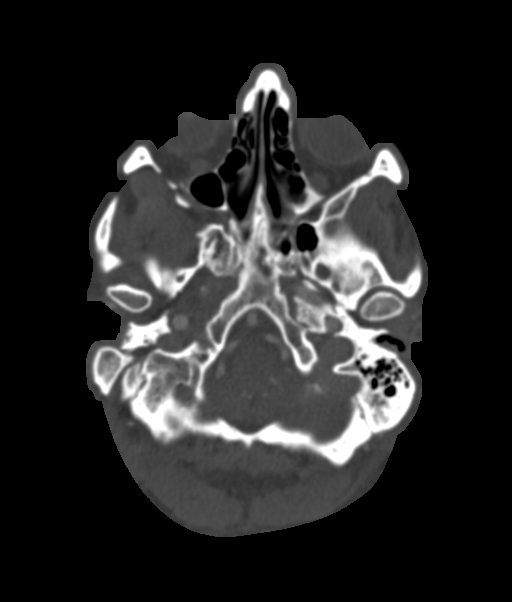
[im 45/167  brain]
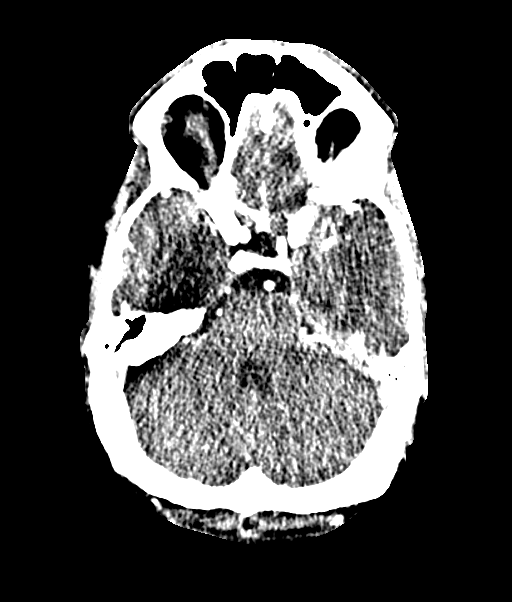
[im 56/167  bone]
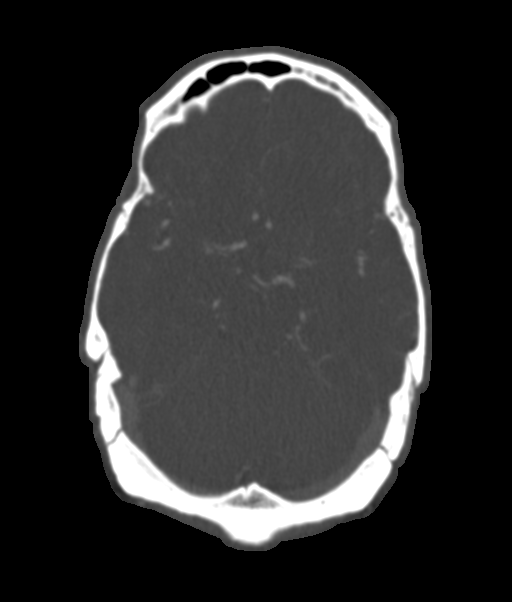
[im 67/167  brain]
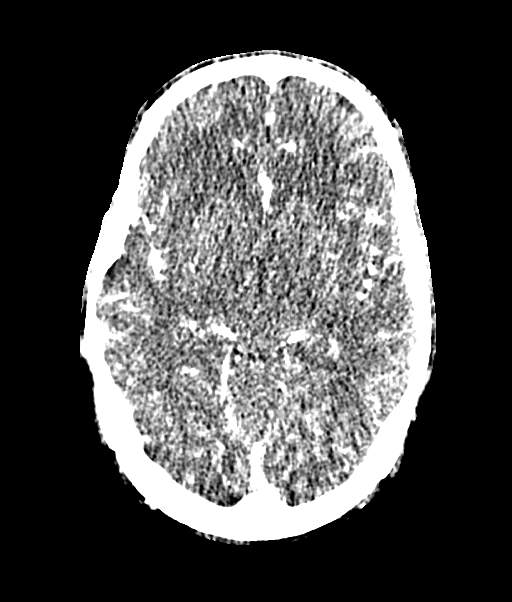
[im 89/167  bone]
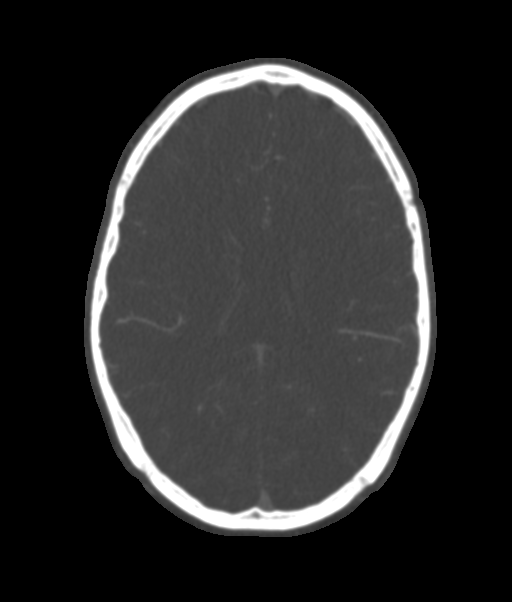
[im 100/167  brain]
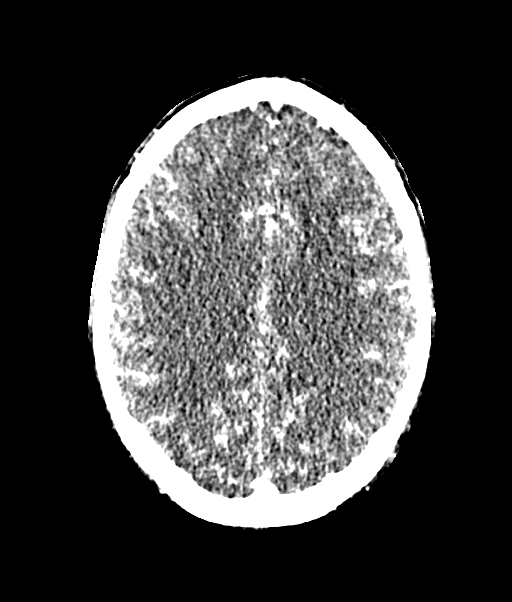
[im 111/167  bone]
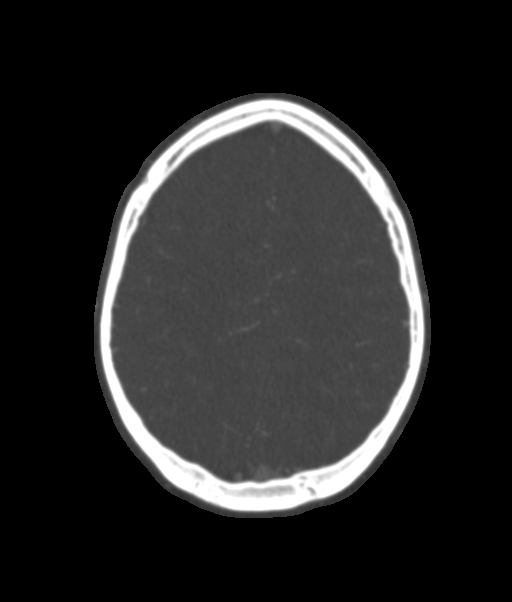
[im 122/167  brain]
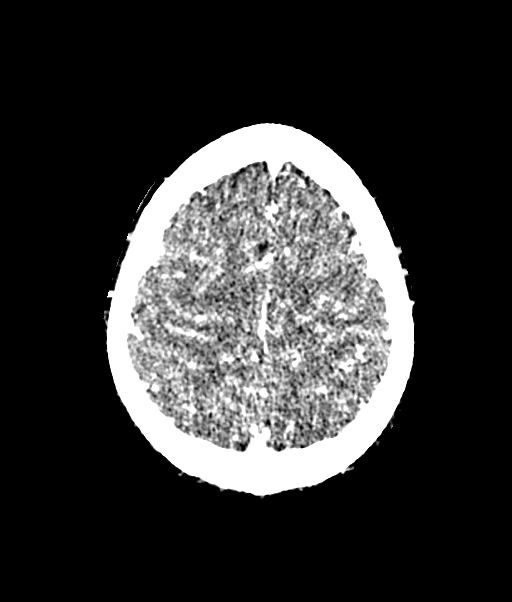
[im 144/167  bone]
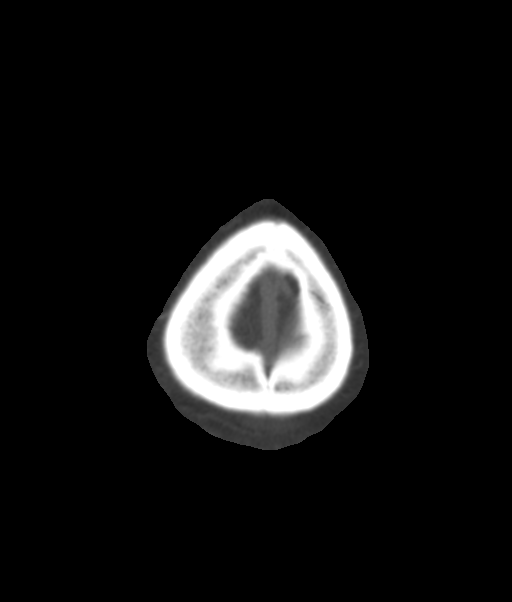
[im 155/167  brain]
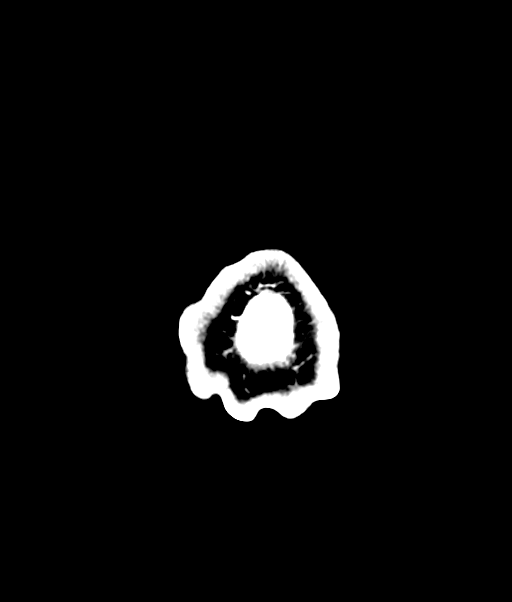

[Series 9: cor thin · coronal · 0.33mm/px · 3 of 231 slices shown]
[im 77/231  brain]
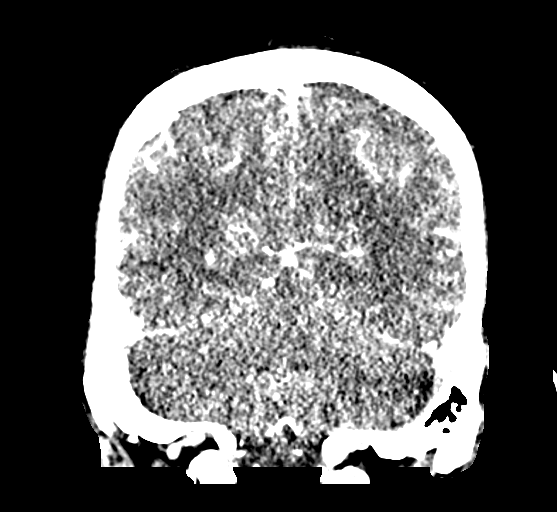
[im 116/231  brain]
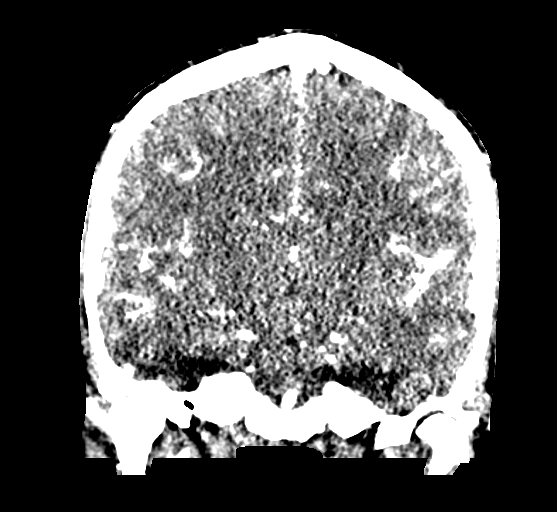
[im 154/231  brain]
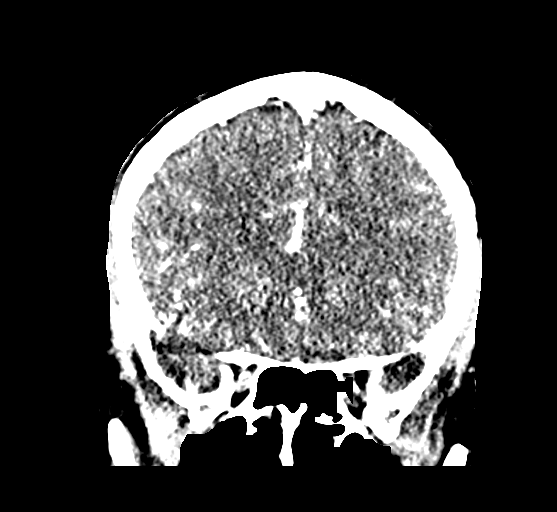

[Series 11: sag thin · sagittal · 0.37mm/px · 3 of 169 slices shown]
[im 43/169  brain]
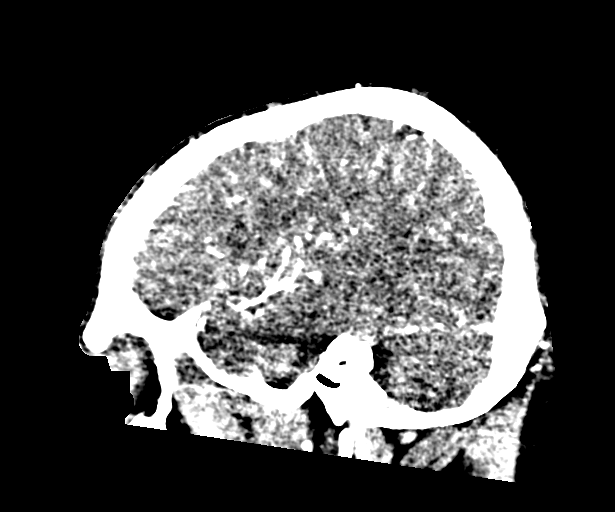
[im 85/169  brain]
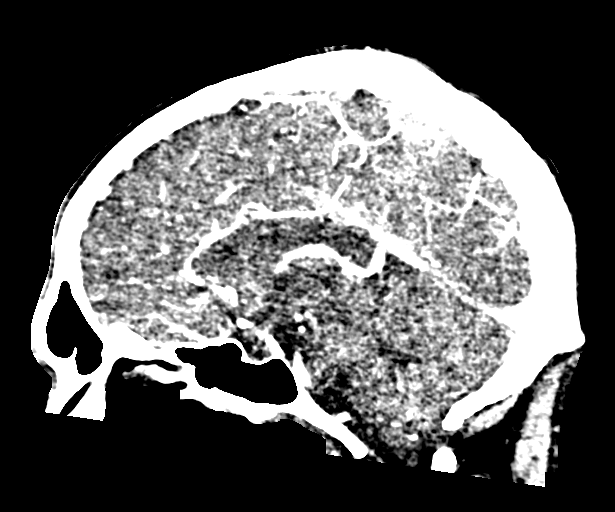
[im 127/169  brain]
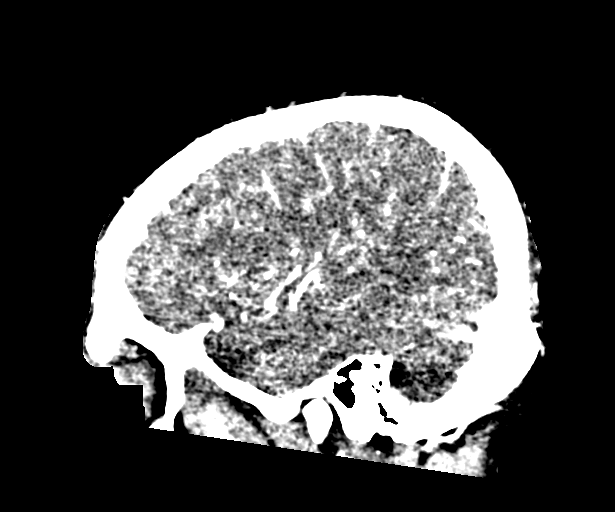

[17 of 47 positions shown; findings below may reference images not displayed]

FINDINGS: CT HEAD

Brain: There is no acute intracranial hemorrhage, mass effect, or
edema. Gray-white differentiation is preserved. There is no
extra-axial fluid collection. Ventricles and sulci are within normal
limits in size and configuration.

Vascular: No hyperdense vessel or unexpected calcification.

Skull: Calvarium is unremarkable.

Sinuses/Orbits: No acute finding.

Other: None.

CTA HEAD

Anterior circulation: Cranial intracranial internal carotid arteries
are patent. Anterior and middle cerebral arteries are patent. An
anterior communicating artery is present.

Posterior circulation: Intracranial vertebral arteries are patent.
PICA origins are patent. Basilar artery is patent. Posterior
cerebral arteries are patent. Right larger than left posterior
communicating arteries are present.

Venous sinuses: As permitted by contrast timing, patent.
IMPRESSION: No acute intracranial abnormality. Normal vascular imaging of the
head.

## 2023-07-11 ENCOUNTER — Ambulatory Visit

## 2023-07-11 ENCOUNTER — Ambulatory Visit
Admission: EM | Admit: 2023-07-11 | Discharge: 2023-07-11 | Disposition: A | Discharge status: Home / Self Care | Attending: Nurse Practitioner | Admitting: Nurse Practitioner

## 2023-07-11 DIAGNOSIS — M25531 Pain in right wrist: Secondary | ICD-10-CM

## 2023-07-11 MED ORDER — KETOROLAC TROMETHAMINE 30 MG/ML IJ SOLN
30.0000 mg | Freq: Once | INTRAMUSCULAR | Status: AC
Start: 2023-07-11 — End: 2023-07-11
  Administered 2023-07-11: 30 mg via INTRAMUSCULAR

## 2023-07-11 NOTE — Discharge Instructions (Addendum)
 The x-ray of your right wrist was negative for fracture or dislocation. You had been given an injection of Toradol  30 mg for pain.  Do not take any additional NSAIDs today such as Advil , Aleve , ibuprofen , or naproxen  today.  You may take Tylenol  for breakthrough pain or discomfort. A wrist brace has been provided to allow for compression and support.  Wear the brace when you are engaged in prolonged or strenuous activity. Recommend use of ice or heat.  Apply ice for pain or swelling, heat for spasm or stiffness.  Apply for 20 minutes, remove for 1 hour, repeat as needed. Gentle range of motion exercises of the right wrist to help decrease your recovery time. As discussed, if symptoms do not improve over the next 1 to 2 weeks, it is recommended that you follow-up with orthopedics for further evaluation. Follow-up as needed.
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
## 2023-08-11 ENCOUNTER — Ambulatory Visit
Admission: EM | Admit: 2023-08-11 | Discharge: 2023-08-11 | Disposition: A | Discharge status: Home / Self Care | Attending: Family Medicine | Admitting: Family Medicine

## 2023-08-11 DIAGNOSIS — S39012A Strain of muscle, fascia and tendon of lower back, initial encounter: Secondary | ICD-10-CM

## 2023-08-11 MED ORDER — TIZANIDINE HCL 4 MG PO CAPS: 4.0000 mg | ORAL_CAPSULE | Freq: Three times a day (TID) | ORAL | 0 refills | Status: AC | PRN

## 2023-08-11 MED ORDER — KETOROLAC TROMETHAMINE 30 MG/ML IJ SOLN
30.0000 mg | Freq: Once | INTRAMUSCULAR | Status: AC
  Administered 2023-08-11: 30 mg via INTRAMUSCULAR
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
## 2023-08-11 NOTE — Discharge Instructions (Addendum)
 We have given you a shot of Toradol  today for pain and inflammation.  Avoid taking ibuprofen  or similar over-the-counter medications for the next 48 hours while this is in your system but you may take Tylenol  as needed additionally.  I have also prescribed a muscle relaxer and you may do heat, massage, stretches and rest.

## 2023-08-11 NOTE — ED Provider Notes (Signed)
 [facility]    CSN: [id number] Arrival date & time: 08/11/23  1400      History   Chief Complaint Chief Complaint  Patient presents with   Back Pain    HPI Francisco Rosales is a 33 y.o. male.   Patient presenting today with 1 day history of bilateral low back pain after lifting something heavy this morning.  States he felt a pop and has had pain and stiffness ever since.  Denies radiation of pain, bowel or bladder incontinence, saddle anesthesias, urinary symptoms, fever, chills.  So far not try anything over-the-counter for symptoms.    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    Patient Active Problem List   Diagnosis Date Noted   GERD (gastroesophageal reflux disease) 06/05/2021   Hematochezia 03/06/2016   Tobacco abuse 03/06/2016   Alcohol abuse 03/06/2016   Bilious vomiting with nausea 03/06/2016   Immunization refused 03/06/2016    Past Surgical History:  Procedure Laterality Date   addenoidectomy     I & D EXTREMITY Right 05/01/2019   Procedure: RIGHT FOREARM IRRIGATION AND DEBRIDEMENT REMOVAL OF MULTIPLE FOREIGN BODIES AND FASCIOTOMIES;  Surgeon: Camella Fallow, MD;  Location: [facility] OR;  Service: Orthopedics;  Laterality: Right;  60 mins   TONSILLECTOMY         Home Medications    Prior to Admission medications   Medication Sig Start Date End Date Taking? Authorizing Provider  ibuprofen  (ADVIL ) 800 MG tablet Take 1 tablet (800 mg total) by mouth every 8 (eight) hours as needed. 10/03/21  Yes Onesimo Oneil LABOR, MD  tiZANidine  (ZANAFLEX ) 4 MG capsule Take 1 capsule (4 mg total) by mouth 3 (three) times daily as needed for muscle spasms. Do not drink alcohol or drive while taking this medication.  May cause drowsiness. 08/11/23  Yes Stuart Vernell Norris, PA-C  acetaminophen  (TYLENOL ) 500 MG tablet Take 1-2 tablets (500-1,000 mg total) by mouth every 6 (six) hours as needed for moderate pain. 10/22/21   Chandra Harlene LABOR, NP   albuterol  (VENTOLIN  HFA) 108 (90 Base) MCG/ACT inhaler Inhale 1-2 puffs into the lungs every 6 (six) hours as needed for wheezing or shortness of breath. Patient not taking: Reported on 06/05/2021 02/18/20   Avegno, Komlanvi S, FNP  cetirizine  (ZYRTEC  ALLERGY) 10 MG tablet Take 1 tablet (10 mg total) by mouth daily. Patient not taking: Reported on 06/05/2021 02/18/20   Avegno, Komlanvi S, FNP  fluticasone  (FLONASE ) 50 MCG/ACT nasal spray Place 1 spray into both nostrils daily for 7 days. 01/18/20 01/25/20  Avegno, Komlanvi S, FNP  omeprazole  (PRILOSEC) 20 MG capsule Take 1 capsule (20 mg total) by mouth daily. 06/05/21   Zarwolo, Gloria, FNP    Family History Family History  Problem Relation Age of Onset   Diabetes Maternal Aunt     Social History Social History   Tobacco Use   Smoking status: Former    Current packs/day: 0.50    Types: Cigarettes   Smokeless tobacco: Former    Types: Chew   Tobacco comments:    trying to quit  Substance Use Topics   Alcohol use: No    Comment: recovered alcoholic   Drug use: No     Allergies   Amoxicillin, Dilaudid  [hydromorphone  hcl], and Keflex  [cephalexin ]   Review of Systems Review of Systems Per HPI  Physical Exam Triage Vital Signs ED Triage Vitals [08/11/23 1410]  Encounter Vitals Group     BP  106/63     Girls Systolic BP Percentile      Girls Diastolic BP Percentile      Boys Systolic BP Percentile      Boys Diastolic BP Percentile      Pulse Rate 73     Resp 18     Temp 98.8 F (37.1 C)     Temp Source Oral     SpO2 97 %     Weight      Height      Head Circumference      Peak Flow      Pain Score 10     Pain Loc      Pain Education      Exclude from Growth Chart    No data found.  Updated Vital Signs BP 106/63 (BP Location: Right Arm)   Pulse 73   Temp 98.8 F (37.1 C) (Oral)   Resp 18   SpO2 97%   Visual Acuity Right Eye Distance:   Left Eye Distance:   Bilateral Distance:    Right Eye Near:   Left  Eye Near:    Bilateral Near:     Physical Exam Vitals and nursing note reviewed.  Constitutional:      Appearance: Normal appearance.  HENT:     Head: Atraumatic.     Mouth/Throat:     Mouth: Mucous membranes are moist.  Eyes:     Extraocular Movements: Extraocular movements intact.     Conjunctiva/sclera: Conjunctivae normal.  Cardiovascular:     Rate and Rhythm: Normal rate.  Pulmonary:     Effort: Pulmonary effort is normal.  Musculoskeletal:        General: Tenderness present. No swelling or deformity. Normal range of motion.     Cervical back: Normal range of motion and neck supple.     Comments: No midline spinal tenderness to palpation diffusely.  Negative straight leg raise bilateral lower extremities.  Bilateral lumbar musculature tender to palpation and mild spasm.  Skin:    General: Skin is warm and dry.  Neurological:     Mental Status: He is oriented to person, place, and time.     Comments: Bilateral lower extremities neurovascularly intact  Psychiatric:        Mood and Affect: Mood normal.        Thought Content: Thought content normal.        Judgment: Judgment normal.      UC Treatments / Results  Labs (all labs ordered are listed, but only abnormal results are displayed) Labs Reviewed - No data to display  EKG   Radiology No results found.  Procedures Procedures (including critical care time)  Medications Ordered in UC Medications  ketorolac  (TORADOL ) 30 MG/ML injection 30 mg (30 mg Intramuscular Given 08/11/23 1448)    Initial Impression / Assessment and Plan / UC Course  I have reviewed the triage vital signs and the nursing notes.  Pertinent labs & imaging results that were available during my care of the patient were reviewed by me and considered in my medical decision making (see chart for details).     Treat with IM Toradol , Zanaflex , heat,/, stretches, rest.  No red flag findings today on exam.  Return for worsening symptoms.  Work  note given. Final Clinical Impressions(s) / UC Diagnoses   Final diagnoses:  Strain of lumbar region, initial encounter     Discharge Instructions      We have given you a shot of Toradol   today for pain and inflammation.  Avoid taking ibuprofen  or similar over-the-counter medications for the next 48 hours while this is in your system but you may take Tylenol  as needed additionally.  I have also prescribed a muscle relaxer and you may do heat, massage, stretches and rest.    ED Prescriptions     Medication Sig Dispense Auth. Provider   tiZANidine  (ZANAFLEX ) 4 MG capsule Take 1 capsule (4 mg total) by mouth 3 (three) times daily as needed for muscle spasms. Do not drink alcohol or drive while taking this medication.  May cause drowsiness. 15 capsule Stuart Vernell Norris, [place]      PDMP not reviewed this encounter.   Stuart Vernell Norris, [place] 08/11/23 1452

## 2024-02-07 ENCOUNTER — Ambulatory Visit
Admission: EM | Admit: 2024-02-07 | Discharge: 2024-02-07 | Disposition: A | Discharge status: Home / Self Care | Payer: Self-pay | Attending: Nurse Practitioner | Admitting: Nurse Practitioner

## 2024-02-07 ENCOUNTER — Other Ambulatory Visit: Payer: Self-pay

## 2024-02-07 DIAGNOSIS — H60501 Unspecified acute noninfective otitis externa, right ear: Secondary | ICD-10-CM

## 2024-02-07 MED ORDER — CIPROFLOXACIN-DEXAMETHASONE 0.3-0.1 % OT SUSP: 4.0000 [drp] | Freq: Two times a day (BID) | OTIC | 0 refills | Status: AC

## 2024-02-07 MED ORDER — FLUTICASONE PROPIONATE 50 MCG/ACT NA SUSP: 2.0000 | Freq: Every day | NASAL | 0 refills | Status: AC

## 2024-02-07 NOTE — ED Provider Notes (Signed)
 " [facility]    CSN: [id number] Arrival date & time: 02/07/24  9180      History   Chief Complaint Chief Complaint  Patient presents with   Otalgia    HPI Francisco Rosales is a 34 y.o. male.   The history is provided by the patient and a parent.   Patient presents with his mother for complaints of right ear pain.  Patient states symptoms started over the past 1 to 2 days.  Patient denies fever, chills, cough, abdominal pain, nausea, vomiting, or diarrhea.  He does endorse malodorous drainage, along with nasal congestion.  Mother reports that she tried to clean the ear and there was drainage did look like a sinus infection coming from his ear.  Patient reports history of chronic problems with the right ear.  He also endorses decreased hearing at this time.  So far, patient has not taken any medications for the symptoms other than Tylenol .  Past Medical History:  Diagnosis Date   Substance abuse (HCC)    alcoholic    Patient Active Problem List   Diagnosis Date Noted   GERD (gastroesophageal reflux disease) 06/05/2021   Hematochezia 03/06/2016   Tobacco abuse 03/06/2016   Alcohol abuse 03/06/2016   Bilious vomiting with nausea 03/06/2016   Immunization refused 03/06/2016    Past Surgical History:  Procedure Laterality Date   addenoidectomy     I & D EXTREMITY Right 05/01/2019   Procedure: RIGHT FOREARM IRRIGATION AND DEBRIDEMENT REMOVAL OF MULTIPLE FOREIGN BODIES AND FASCIOTOMIES;  Surgeon: Camella Fallow, MD;  Location: [facility] OR;  Service: Orthopedics;  Laterality: Right;  60 mins   TONSILLECTOMY         Home Medications    Prior to Admission medications  Medication Sig Start Date End Date Taking? Authorizing Provider  acetaminophen  (TYLENOL ) 500 MG tablet Take 1-2 tablets (500-1,000 mg total) by mouth every 6 (six) hours as needed for moderate pain. Patient not taking: Reported on 02/07/2024 10/22/21   Chandra Harlene LABOR, NP  albuterol  (VENTOLIN  HFA) 108 (90  Base) MCG/ACT inhaler Inhale 1-2 puffs into the lungs every 6 (six) hours as needed for wheezing or shortness of breath. Patient not taking: Reported on 06/05/2021 02/18/20   Avegno, Komlanvi S, FNP  cetirizine  (ZYRTEC  ALLERGY) 10 MG tablet Take 1 tablet (10 mg total) by mouth daily. Patient not taking: Reported on 06/05/2021 02/18/20   Avegno, Komlanvi S, FNP  fluticasone  (FLONASE ) 50 MCG/ACT nasal spray Place 1 spray into both nostrils daily for 7 days. 01/18/20 01/25/20  Avegno, Komlanvi S, FNP  ibuprofen  (ADVIL ) 800 MG tablet Take 1 tablet (800 mg total) by mouth every 8 (eight) hours as needed. 10/03/21   Onesimo Oneil LABOR, MD  omeprazole  (PRILOSEC) 20 MG capsule Take 1 capsule (20 mg total) by mouth daily. Patient not taking: Reported on 02/07/2024 06/05/21   Bacchus, Gloria Z, FNP  tiZANidine  (ZANAFLEX ) 4 MG capsule Take 1 capsule (4 mg total) by mouth 3 (three) times daily as needed for muscle spasms. Do not drink alcohol or drive while taking this medication.  May cause drowsiness. Patient not taking: Reported on 02/07/2024 08/11/23   Stuart Vernell Norris, PA-C    Family History Family History  Problem Relation Age of Onset   Diabetes Maternal Aunt     Social History Social History[1]   Allergies   Amoxicillin, Dilaudid  [hydromorphone  hcl], and Keflex  [cephalexin ]   Review of Systems Review of Systems Per HPI  Physical Exam Triage Vital Signs  ED Triage Vitals  Encounter Vitals Group     BP 02/07/24 0830 119/74     Girls Systolic BP Percentile --      Girls Diastolic BP Percentile --      Boys Systolic BP Percentile --      Boys Diastolic BP Percentile --      Pulse Rate 02/07/24 0830 82     Resp 02/07/24 0830 18     Temp 02/07/24 0830 98.6 F (37 C)     Temp Source 02/07/24 0830 Oral     SpO2 02/07/24 0830 97 %     Weight --      Height --      Head Circumference --      Peak Flow --      Pain Score 02/07/24 0831 10     Pain Loc --      Pain Education --      Exclude from  Growth Chart --    No data found.  Updated Vital Signs BP 119/74 (BP Location: Right Arm)   Pulse 82   Temp 98.6 F (37 C) (Oral)   Resp 18   SpO2 97%   Visual Acuity Right Eye Distance:   Left Eye Distance:   Bilateral Distance:    Right Eye Near:   Left Eye Near:    Bilateral Near:     Physical Exam Vitals and nursing note reviewed.  Constitutional:      General: He is not in acute distress.    Appearance: Normal appearance.  HENT:     Head: Normocephalic.     Right Ear: External ear normal. Decreased hearing noted. Swelling (Right ear canal) and tenderness (Tenderness noted to the tragus and pinna of the right ear.  Patient also with tenderness with placement of the otoscope into the right ear canal) present.     Left Ear: Tympanic membrane, ear canal and external ear normal.     Ears:     Comments: Moderate swelling noted to the right ear canal.  Patient with discomfort noted with placement of the otoscope.  Unable to visualize the middle ear due to swelling.    Nose: Congestion present.     Mouth/Throat:     Mouth: Mucous membranes are moist.  Eyes:     Extraocular Movements: Extraocular movements intact.     Conjunctiva/sclera: Conjunctivae normal.     Pupils: Pupils are equal, round, and reactive to light.  Cardiovascular:     Rate and Rhythm: Normal rate and regular rhythm.     Pulses: Normal pulses.     Heart sounds: Normal heart sounds.  Pulmonary:     Effort: Pulmonary effort is normal. No respiratory distress.     Breath sounds: Normal breath sounds. No stridor. No wheezing, rhonchi or rales.  Abdominal:     General: Bowel sounds are normal.     Palpations: Abdomen is soft.  Musculoskeletal:     Cervical back: Normal range of motion.  Skin:    General: Skin is warm and dry.  Neurological:     General: No focal deficit present.     Mental Status: He is alert and oriented to person, place, and time.  Psychiatric:        Mood and Affect: Mood normal.         Behavior: Behavior normal.      UC Treatments / Results  Labs (all labs ordered are listed, but only abnormal results are displayed) Labs Reviewed -  No data to display  EKG   Radiology No results found.  Procedures Procedures (including critical care time)  Medications Ordered in UC Medications - No data to display  Initial Impression / Assessment and Plan / UC Course  I have reviewed the triage vital signs and the nursing notes.  Pertinent labs & imaging results that were available during my care of the patient were reviewed by me and considered in my medical decision making (see chart for details).  Patient with complaints of right ear pain that started over the past 1 to 2 days.  On exam, he has moderate swelling noted to the right ear canal along with tenderness to the tragus and pinna of the right ear.  Symptoms are consistent with right otitis externa.  Will treat with Ciprodex  otic suspension and fluticasone  50 mcg nasal spray.  Supportive care recommendations were provided and discussed with the patient and his mother to include over-the-counter analgesics, warm compresses to the ear, and avoiding entrance of water inside of the ear.  Discussed with patient that if symptoms fail to improve over the next 48 to 72 hours, or worsen before that time, recommend immediate follow-up in this clinic or with his primary care physician for further evaluation.  Patient and mother were in agreement with this plan of care and verbalized understanding.  All questions were answered.  Patient stable for discharge.  Final Clinical Impressions(s) / UC Diagnoses   Final diagnoses:  None   Discharge Instructions   None    ED Prescriptions   None    PDMP not reviewed this encounter.     [1]  Social History Tobacco Use   Smoking status: Every Day    Current packs/day: 0.50    Types: Cigarettes   Smokeless tobacco: Former    Types: Chew   Tobacco comments:    trying to  quit  Vaping Use   Vaping status: Never Used  Substance Use Topics   Alcohol use: No    Comment: recovered alcoholic   Drug use: No     Gilmer Etta PARAS, NP 02/07/24 [phone]  "

## 2024-02-07 NOTE — Discharge Instructions (Addendum)
 Take medication as prescribed. You may take over-the-counter Tylenol  or ibuprofen  as needed for pain, fever, or general discomfort. Warm compresses to the affected ear help with comfort. Do not stick anything inside the ear while symptoms persist. Avoid getting water inside of the ear while symptoms persist. If your symptoms fail to improve over the next 48 to 72 hours, or if your symptoms begin to worsen, follow-up in this clinic or with your primary care physician for further evaluation. Follow-up as needed. Follow-up if symptoms do not improve.

## 2024-02-07 NOTE — ED Triage Notes (Signed)
 Pt being seen in UC for R ear pain. Pt reports pain started approximately 2 days ago. Pt family reports noticing odorous fluid draining from ear. Pt denies otc medication today for pain.
